# Patient Record
Sex: Male | Born: 1955 | Race: Black or African American | Hispanic: No | Marital: Single | Smoking: Former smoker
Health system: Southern US, Community
[De-identification: ages and names within clinical notes are randomized; demographics above are authoritative.]

## PROBLEM LIST (undated history)

## (undated) DIAGNOSIS — M1990 Unspecified osteoarthritis, unspecified site: Secondary | ICD-10-CM

## (undated) DIAGNOSIS — J189 Pneumonia, unspecified organism: Secondary | ICD-10-CM

## (undated) DIAGNOSIS — C801 Malignant (primary) neoplasm, unspecified: Secondary | ICD-10-CM

## (undated) DIAGNOSIS — H269 Unspecified cataract: Secondary | ICD-10-CM

## (undated) DIAGNOSIS — E119 Type 2 diabetes mellitus without complications: Secondary | ICD-10-CM

## (undated) DIAGNOSIS — I10 Essential (primary) hypertension: Secondary | ICD-10-CM

## (undated) DIAGNOSIS — K219 Gastro-esophageal reflux disease without esophagitis: Secondary | ICD-10-CM

## (undated) DIAGNOSIS — K8020 Calculus of gallbladder without cholecystitis without obstruction: Secondary | ICD-10-CM

## (undated) HISTORY — DX: Gastro-esophageal reflux disease without esophagitis: K21.9

## (undated) HISTORY — PX: TONSILLECTOMY: SUR1361

## (undated) HISTORY — PX: PROSTATE BIOPSY: SHX241

## (undated) HISTORY — DX: Calculus of gallbladder without cholecystitis without obstruction: K80.20

## (undated) HISTORY — DX: Unspecified cataract: H26.9

## (undated) HISTORY — PX: OTHER SURGICAL HISTORY: SHX169

---

## 2020-02-16 ENCOUNTER — Other Ambulatory Visit: Payer: Self-pay

## 2020-02-16 ENCOUNTER — Ambulatory Visit (HOSPITAL_COMMUNITY)
Admission: EM | Admit: 2020-02-16 | Discharge: 2020-02-16 | Disposition: A | Discharge status: Home / Self Care | Payer: Federal, State, Local not specified - PPO | Attending: Internal Medicine | Admitting: Internal Medicine

## 2020-02-16 ENCOUNTER — Encounter (HOSPITAL_COMMUNITY): Payer: Self-pay

## 2020-02-16 DIAGNOSIS — Z20822 Contact with and (suspected) exposure to covid-19: Secondary | ICD-10-CM | POA: Insufficient documentation

## 2020-02-16 HISTORY — DX: Type 2 diabetes mellitus without complications: E11.9

## 2020-02-16 HISTORY — DX: Essential (primary) hypertension: I10

## 2020-02-16 NOTE — ED Triage Notes (Signed)
Pt presents for COVID testing after exposure. Denies any symptoms.  

## 2020-02-17 LAB — SARS CORONAVIRUS 2 (TAT 6-24 HRS): SARS Coronavirus 2: NEGATIVE

## 2020-08-21 ENCOUNTER — Other Ambulatory Visit: Payer: Self-pay

## 2020-08-21 ENCOUNTER — Observation Stay (HOSPITAL_COMMUNITY)
Admission: EM | Admit: 2020-08-21 | Discharge: 2020-08-25 | Disposition: A | Discharge status: Home / Self Care | Payer: Medicare Other | Attending: Family Medicine | Admitting: Family Medicine

## 2020-08-21 ENCOUNTER — Encounter (HOSPITAL_COMMUNITY): Payer: Self-pay

## 2020-08-21 DIAGNOSIS — K8064 Calculus of gallbladder and bile duct with chronic cholecystitis without obstruction: Principal | ICD-10-CM | POA: Insufficient documentation

## 2020-08-21 DIAGNOSIS — K8050 Calculus of bile duct without cholangitis or cholecystitis without obstruction: Secondary | ICD-10-CM

## 2020-08-21 DIAGNOSIS — I10 Essential (primary) hypertension: Secondary | ICD-10-CM | POA: Diagnosis not present

## 2020-08-21 DIAGNOSIS — K838 Other specified diseases of biliary tract: Secondary | ICD-10-CM | POA: Insufficient documentation

## 2020-08-21 DIAGNOSIS — R1013 Epigastric pain: Secondary | ICD-10-CM | POA: Diagnosis present

## 2020-08-21 DIAGNOSIS — E119 Type 2 diabetes mellitus without complications: Secondary | ICD-10-CM | POA: Insufficient documentation

## 2020-08-21 DIAGNOSIS — Z20822 Contact with and (suspected) exposure to covid-19: Secondary | ICD-10-CM | POA: Diagnosis not present

## 2020-08-21 DIAGNOSIS — E876 Hypokalemia: Secondary | ICD-10-CM | POA: Diagnosis not present

## 2020-08-21 DIAGNOSIS — K8020 Calculus of gallbladder without cholecystitis without obstruction: Secondary | ICD-10-CM

## 2020-08-21 DIAGNOSIS — Z7984 Long term (current) use of oral hypoglycemic drugs: Secondary | ICD-10-CM | POA: Insufficient documentation

## 2020-08-21 DIAGNOSIS — R748 Abnormal levels of other serum enzymes: Secondary | ICD-10-CM

## 2020-08-21 DIAGNOSIS — K8071 Calculus of gallbladder and bile duct without cholecystitis with obstruction: Secondary | ICD-10-CM

## 2020-08-21 DIAGNOSIS — Z79899 Other long term (current) drug therapy: Secondary | ICD-10-CM | POA: Diagnosis not present

## 2020-08-21 DIAGNOSIS — E785 Hyperlipidemia, unspecified: Secondary | ICD-10-CM

## 2020-08-21 DIAGNOSIS — Z419 Encounter for procedure for purposes other than remedying health state, unspecified: Secondary | ICD-10-CM

## 2020-08-21 LAB — COMPREHENSIVE METABOLIC PANEL
ALT: 444 U/L — ABNORMAL HIGH (ref 0–44)
AST: 365 U/L — ABNORMAL HIGH (ref 15–41)
Albumin: 3.9 g/dL (ref 3.5–5.0)
Alkaline Phosphatase: 297 U/L — ABNORMAL HIGH (ref 38–126)
Anion gap: 13 (ref 5–15)
BUN: 17 mg/dL (ref 8–23)
CO2: 26 mmol/L (ref 22–32)
Calcium: 9.4 mg/dL (ref 8.9–10.3)
Chloride: 100 mmol/L (ref 98–111)
Creatinine, Ser: 1.17 mg/dL (ref 0.61–1.24)
GFR, Estimated: >60 mL/min (ref >60)
Glucose, Bld: 167 mg/dL — ABNORMAL HIGH (ref 70–99)
Potassium: 3.4 mmol/L — ABNORMAL LOW (ref 3.5–5.1)
Sodium: 139 mmol/L (ref 135–145)
Total Bilirubin: 2.5 mg/dL — ABNORMAL HIGH (ref 0.3–1.2)
Total Protein: 7.5 g/dL (ref 6.5–8.1)

## 2020-08-21 LAB — CBC
HCT: 44.7 % (ref 39.0–52.0)
Hemoglobin: 14.2 g/dL (ref 13.0–17.0)
MCH: 26.9 pg (ref 26.0–34.0)
MCHC: 31.8 g/dL (ref 30.0–36.0)
MCV: 84.7 fL (ref 80.0–100.0)
Platelets: 199 10*3/uL (ref 150–400)
RBC: 5.28 MIL/uL (ref 4.22–5.81)
RDW: 15.5 % (ref 11.5–15.5)
WBC: 8.1 10*3/uL (ref 4.0–10.5)
nRBC: 0 % (ref 0.0–0.2)

## 2020-08-21 LAB — URINALYSIS, ROUTINE W REFLEX MICROSCOPIC
Bilirubin Urine: NEGATIVE
Glucose, UA: NEGATIVE mg/dL
Hgb urine dipstick: NEGATIVE
Ketones, ur: NEGATIVE mg/dL
Leukocytes,Ua: NEGATIVE
Nitrite: NEGATIVE
Protein, ur: NEGATIVE mg/dL
Specific Gravity, Urine: 1.02 (ref 1.005–1.030)
pH: 6 (ref 5.0–8.0)

## 2020-08-21 LAB — LIPASE, BLOOD: Lipase: 32 U/L (ref 11–51)

## 2020-08-21 NOTE — ED Notes (Signed)
Urinal placed at bedside and requested for patient to provide a sample and to use call bell when he can provide one.

## 2020-08-21 NOTE — ED Triage Notes (Signed)
Pt reports intermittent epigastric x1 month with mild nausea. Denies V/D. Pt states he has trie multiple OTC medications and even used a fleet enema with no relief.

## 2020-08-21 NOTE — ED Provider Notes (Incomplete)
[facility]-EMERGENCY DEPT Provider Note   CSN: [id number] Arrival date & time: 08/21/20  2156     History Chief Complaint  Patient presents with  . Abdominal Pain    Lawrence Macdonald is a 65 y.o. male with PMH of type II DM and HTN who presents the ED with a 1 month history of epigastric discomfort with mild nausea symptoms.  Patient states that he has tried over-the-counter medications including Fleet enema without any significant relief.  On my examination, patient reports that he recently moved here from [place] to retire. He has not yet established with a primary care provider. He has been experiencing epigastric abdominal discomfort, worse after eating, for the past month. He states the episodes last approximately 3 to 4 hours before resolving spontaneously. On my exam, he is nontoxic endorsing any current abdominal pain. He states that he has early satiety and battles constipation. He has been trying enemas in addition to laxative medications, without any significant relief. He also has been taking proton pump inhibitors over-the-counter without any significant relief of his epigastric discomfort.  Patient is on Metformin for diabetes and also takes multiple antihypertensive medications. He denies any chest pain, fevers or chills, recent illness or infection, history of abdominal surgeries, urinary symptoms, melena, or hematochezia. Most recent BM was earlier today.   HPI     Past Medical History:  Diagnosis Date  . Diabetes mellitus without complication (HCC)   . Hypertension     There are no problems to display for this patient.   Past Surgical History:  Procedure Laterality Date  . TONSILLECTOMY         History reviewed. No pertinent family history.  Social History   Tobacco Use  . Smoking status: Never Smoker  . Smokeless tobacco: Never Used  Substance Use Topics  . Alcohol use: Yes  . Drug use: Never    Home Medications Prior to  Admission medications   Medication Sig Start Date End Date Taking? Authorizing Provider  albuterol (VENTOLIN HFA) 108 (90 Base) MCG/ACT inhaler  05/28/16   [provider]  amLODipine (NORVASC) 10 MG tablet Take by mouth. 06/26/19 06/20/20  [provider]  metFORMIN (GLUCOPHAGE) 500 MG tablet Take 500 mg by mouth daily. 02/13/20   [provider]  sildenafil (VIAGRA) 50 MG tablet SMARTSIG:1 Tablet(s) By Mouth 01/26/20   [provider]  valsartan (DIOVAN) 320 MG tablet Take 320 mg by mouth daily. 11/25/19   [provider]    Allergies    Hydrochlorothiazide, Lisinopril, and Sulfadiazine  Review of Systems   Review of Systems  Physical Exam Updated Vital Signs BP (!) 151/79   Pulse 75   Temp 98.4 F (36.9 C) (Oral)   Resp 15   SpO2 97%   Physical Exam Vitals and nursing note reviewed. Exam conducted with a chaperone present.  HENT:     Head: Normocephalic and atraumatic.  Eyes:     General: No scleral icterus.    Conjunctiva/sclera: Conjunctivae normal.  Cardiovascular:     Rate and Rhythm: Normal rate and regular rhythm.     Pulses: Normal pulses.  Pulmonary:     Effort: Pulmonary effort is normal. No respiratory distress.     Breath sounds: No wheezing or rales.  Abdominal:     General: Abdomen is flat. There is no distension.     Palpations: Abdomen is soft.     Tenderness: There is abdominal tenderness. There is no guarding.  Comments: Mild generalized tenderness to palpation, most notably in epigastrium. No overlying skin changes. Mild diastases recti. Protuberant abdomen. Soft. Negative Murphy sign. Negative McBurney's point tenderness. NABS.  Skin:    General: Skin is dry.  Neurological:     Mental Status: He is alert.     GCS: GCS eye subscore is 4. GCS verbal subscore is 5. GCS motor subscore is 6.  Psychiatric:        Mood and Affect: Mood normal.        Behavior: Behavior normal.        Thought Content: Thought  content normal.     ED Results / Procedures / Treatments   Labs (all labs ordered are listed, but only abnormal results are displayed) Labs Reviewed  CBC  LIPASE, BLOOD  COMPREHENSIVE METABOLIC PANEL  URINALYSIS, ROUTINE W REFLEX MICROSCOPIC    EKG None  Radiology No results found.  Procedures Procedures {Remember to document critical care time when appropriate:1}  Medications Ordered in ED Medications - No data to display  ED Course  I have reviewed the triage vital signs and the nursing notes.  Pertinent labs & imaging results that were available during my care of the patient were reviewed by me and considered in my medical decision making (see chart for details).    MDM Rules/Calculators/A&P                          Lawrence Macdonald was evaluated in Emergency Department on 08/21/2020 for the symptoms described in the history of present illness. He was evaluated in the context of the global COVID-19 pandemic, which necessitated consideration that the patient might be at risk for infection with the SARS-CoV-2 virus that causes COVID-19. Institutional protocols and algorithms that pertain to the evaluation of patients at risk for COVID-19 are in a state of rapid change based on information released by regulatory bodies including the CDC and federal and state organizations. These policies and algorithms were followed during the patient's care in the ED.  I personally reviewed patient's medical chart and all notes from triage and staff during today's encounter. I have also ordered and reviewed all labs and imaging that I felt to be medically necessary in the evaluation of this patient's complaints and with consideration of their physical exam. If needed, translation services were available and utilized.   ***      Final Clinical Impression(s) / ED Diagnoses Final diagnoses:  None    Rx / DC Orders ED Discharge Orders    None

## 2020-08-22 ENCOUNTER — Emergency Department (HOSPITAL_COMMUNITY): Payer: Medicare Other

## 2020-08-22 ENCOUNTER — Observation Stay (HOSPITAL_COMMUNITY): Payer: Medicare Other

## 2020-08-22 DIAGNOSIS — I10 Essential (primary) hypertension: Secondary | ICD-10-CM | POA: Diagnosis not present

## 2020-08-22 DIAGNOSIS — R748 Abnormal levels of other serum enzymes: Secondary | ICD-10-CM | POA: Diagnosis not present

## 2020-08-22 DIAGNOSIS — E785 Hyperlipidemia, unspecified: Secondary | ICD-10-CM

## 2020-08-22 DIAGNOSIS — K8001 Calculus of gallbladder with acute cholecystitis with obstruction: Secondary | ICD-10-CM

## 2020-08-22 DIAGNOSIS — E876 Hypokalemia: Secondary | ICD-10-CM

## 2020-08-22 DIAGNOSIS — K8064 Calculus of gallbladder and bile duct with chronic cholecystitis without obstruction: Secondary | ICD-10-CM | POA: Diagnosis not present

## 2020-08-22 DIAGNOSIS — K8020 Calculus of gallbladder without cholecystitis without obstruction: Secondary | ICD-10-CM | POA: Diagnosis present

## 2020-08-22 LAB — HIV ANTIBODY (ROUTINE TESTING W REFLEX): HIV Screen 4th Generation wRfx: NONREACTIVE

## 2020-08-22 LAB — HEPATIC FUNCTION PANEL
ALT: 505 U/L — ABNORMAL HIGH (ref 0–44)
AST: 400 U/L — ABNORMAL HIGH (ref 15–41)
Albumin: 3.7 g/dL (ref 3.5–5.0)
Alkaline Phosphatase: 302 U/L — ABNORMAL HIGH (ref 38–126)
Bilirubin, Direct: 1.7 mg/dL — ABNORMAL HIGH (ref 0.0–0.2)
Indirect Bilirubin: 1.6 mg/dL — ABNORMAL HIGH (ref 0.3–0.9)
Total Bilirubin: 3.3 mg/dL — ABNORMAL HIGH (ref 0.3–1.2)
Total Protein: 7.2 g/dL (ref 6.5–8.1)

## 2020-08-22 LAB — CBC
HCT: 46 % (ref 39.0–52.0)
Hemoglobin: 14.8 g/dL (ref 13.0–17.0)
MCH: 27.1 pg (ref 26.0–34.0)
MCHC: 32.2 g/dL (ref 30.0–36.0)
MCV: 84.2 fL (ref 80.0–100.0)
Platelets: 199 10*3/uL (ref 150–400)
RBC: 5.46 MIL/uL (ref 4.22–5.81)
RDW: 15.5 % (ref 11.5–15.5)
WBC: 7.9 10*3/uL (ref 4.0–10.5)
nRBC: 0 % (ref 0.0–0.2)

## 2020-08-22 LAB — GLUCOSE, CAPILLARY
Glucose-Capillary: 154 mg/dL — ABNORMAL HIGH (ref 70–99)
Glucose-Capillary: 124 mg/dL — ABNORMAL HIGH (ref 70–99)
Glucose-Capillary: 98 mg/dL (ref 70–99)

## 2020-08-22 LAB — HEMOGLOBIN A1C
Hgb A1c MFr Bld: 7.2 % — ABNORMAL HIGH (ref 4.8–5.6)
Mean Plasma Glucose: 159.94 mg/dL

## 2020-08-22 LAB — SARS CORONAVIRUS 2 (TAT 6-24 HRS): SARS Coronavirus 2: NEGATIVE

## 2020-08-22 LAB — MAGNESIUM: Magnesium: 2.2 mg/dL (ref 1.7–2.4)

## 2020-08-22 LAB — CBG MONITORING, ED: Glucose-Capillary: 196 mg/dL — ABNORMAL HIGH (ref 70–99)

## 2020-08-22 MED ORDER — INFLUENZA VAC A&B SA ADJ QUAD 0.5 ML IM PRSY
0.5000 mL | PREFILLED_SYRINGE | INTRAMUSCULAR | Status: DC
  Filled 2020-08-22: qty 0.5

## 2020-08-22 MED ORDER — ONDANSETRON HCL 4 MG/2ML IJ SOLN
4.0000 mg | Freq: Four times a day (QID) | INTRAMUSCULAR | Status: DC | PRN
  Administered 2020-08-22: 4 mg via INTRAVENOUS
  Filled 2020-08-22: qty 2

## 2020-08-22 MED ORDER — AMLODIPINE BESYLATE 10 MG PO TABS
10.0000 mg | ORAL_TABLET | Freq: Every day | ORAL | Status: DC
  Administered 2020-08-22 – 2020-08-25 (×3): 10 mg via ORAL
  Filled 2020-08-22 (×3): qty 1

## 2020-08-22 MED ORDER — ATORVASTATIN CALCIUM 40 MG PO TABS: 40.0000 mg | ORAL_TABLET | Freq: Every day | ORAL | Status: DC

## 2020-08-22 MED ORDER — INSULIN ASPART 100 UNIT/ML ~~LOC~~ SOLN
0.0000 [IU] | SUBCUTANEOUS | Status: DC
  Administered 2020-08-22: 2 [IU] via SUBCUTANEOUS
  Administered 2020-08-22: 1 [IU] via SUBCUTANEOUS
  Administered 2020-08-22: 2 [IU] via SUBCUTANEOUS
  Administered 2020-08-22: 1 [IU] via SUBCUTANEOUS
  Administered 2020-08-23: 3 [IU] via SUBCUTANEOUS
  Administered 2020-08-24: 2 [IU] via SUBCUTANEOUS
  Administered 2020-08-24 (×2): 1 [IU] via SUBCUTANEOUS
  Administered 2020-08-24: 2 [IU] via SUBCUTANEOUS
  Administered 2020-08-25 (×2): 1 [IU] via SUBCUTANEOUS
  Filled 2020-08-22: qty 0.09

## 2020-08-22 MED ORDER — GADOBUTROL 1 MMOL/ML IV SOLN
10.0000 mL | Freq: Once | INTRAVENOUS | Status: AC | PRN
  Administered 2020-08-22: 10 mL via INTRAVENOUS

## 2020-08-22 MED ORDER — SODIUM CHLORIDE 0.9 % IV SOLN
2.0000 g | INTRAVENOUS | Status: DC
  Administered 2020-08-22 – 2020-08-25 (×4): 2 g via INTRAVENOUS
  Filled 2020-08-22: qty 2
  Filled 2020-08-22: qty 20
  Filled 2020-08-22 (×2): qty 2

## 2020-08-22 MED ORDER — SODIUM CHLORIDE 0.9 % IV SOLN: INTRAVENOUS | Status: DC

## 2020-08-22 MED ORDER — HEPARIN SODIUM (PORCINE) 5000 UNIT/ML IJ SOLN
5000.0000 [IU] | Freq: Three times a day (TID) | INTRAMUSCULAR | Status: DC
  Administered 2020-08-22 (×3): 5000 [IU] via SUBCUTANEOUS
  Filled 2020-08-22 (×3): qty 1

## 2020-08-22 MED ORDER — IRBESARTAN 150 MG PO TABS
300.0000 mg | ORAL_TABLET | Freq: Every day | ORAL | Status: DC
  Administered 2020-08-22 – 2020-08-25 (×3): 300 mg via ORAL
  Filled 2020-08-22: qty 1
  Filled 2020-08-22 (×3): qty 2

## 2020-08-22 MED ORDER — METRONIDAZOLE IN NACL 5-0.79 MG/ML-% IV SOLN
500.0000 mg | Freq: Three times a day (TID) | INTRAVENOUS | Status: DC
  Administered 2020-08-22 – 2020-08-25 (×10): 500 mg via INTRAVENOUS
  Filled 2020-08-22 (×10): qty 100

## 2020-08-22 MED ORDER — POTASSIUM CHLORIDE 10 MEQ/100ML IV SOLN
10.0000 meq | INTRAVENOUS | Status: AC
  Administered 2020-08-22 (×2): 10 meq via INTRAVENOUS
  Filled 2020-08-22 (×2): qty 100

## 2020-08-22 MED ORDER — LORAZEPAM 2 MG/ML IJ SOLN
1.0000 mg | Freq: Once | INTRAMUSCULAR | Status: AC
  Administered 2020-08-22: 1 mg via INTRAVENOUS
  Filled 2020-08-22: qty 1

## 2020-08-22 MED ORDER — MORPHINE SULFATE (PF) 2 MG/ML IV SOLN
1.0000 mg | INTRAVENOUS | Status: DC | PRN
  Administered 2020-08-22: 1 mg via INTRAVENOUS
  Filled 2020-08-22: qty 1

## 2020-08-22 MED ORDER — LACTATED RINGERS IV SOLN: INTRAVENOUS | Status: DC

## 2020-08-22 MED ORDER — HYDRALAZINE HCL 50 MG PO TABS
50.0000 mg | ORAL_TABLET | Freq: Two times a day (BID) | ORAL | Status: DC
  Administered 2020-08-22 – 2020-08-25 (×7): 50 mg via ORAL
  Filled 2020-08-22 (×3): qty 1
  Filled 2020-08-22: qty 2
  Filled 2020-08-22 (×3): qty 1

## 2020-08-22 NOTE — Consult Note (Signed)
Referring Provider: Evelena Leyden, PA-C (ED) Primary Care Physician:  Patient, No Pcp Per Primary Gastroenterologist:  Gentry Fitz  Reason for Consultation:  Abnormal LFTs  HPI: Lawrence Macdonald is a 65 y.o. male with history of DM type 2 and HTN presenting for consultation of abnormal LFTs.  He presented to the ED yesterday with epigastric and RUQ abdominal pain.  He states he has been having this pain intermittently for 2-3 weeks, but it acutely worsened yesterday, which led him to present to the ED.  Pain is typically worse after eating.  He denies pain currently, as he just received pain medication.  He has nausea but denies vomiting.  Denies changes in bowel habits, melena, hematochezia, unexplained weight loss.  He denies ASA, NSAID, or blood thinner use.  Denies family history of colon cancer or gastrointestinal malignancy. He has never had a colonoscopy.  Past Medical History:  Diagnosis Date  . Diabetes mellitus without complication (HCC)   . Hypertension     Past Surgical History:  Procedure Laterality Date  . TONSILLECTOMY      Prior to Admission medications   Medication Sig Start Date End Date Taking? Authorizing Provider  albuterol (VENTOLIN HFA) 108 (90 Base) MCG/ACT inhaler  05/28/16  Yes [provider]  amLODipine (NORVASC) 10 MG tablet Take 10 mg by mouth daily. 06/26/19 08/22/20 Yes [provider]  atorvastatin (LIPITOR) 40 MG tablet Take 40 mg by mouth daily.   Yes [provider]  hydrALAZINE (APRESOLINE) 50 MG tablet Take 50 mg by mouth 2 (two) times daily. 06/26/20  Yes [provider]  metFORMIN (GLUCOPHAGE) 500 MG tablet Take 500 mg by mouth daily. 02/13/20  Yes [provider]  naproxen sodium (ALEVE) 220 MG tablet Take 220 mg by mouth 2 (two) times daily as needed (pain).   Yes [provider]  valsartan (DIOVAN) 320 MG tablet Take 320 mg by mouth daily. 11/25/19  Yes [provider]    Scheduled  Meds: . amLODipine  10 mg Oral Daily  . heparin  5,000 Units Subcutaneous Q8H  . hydrALAZINE  50 mg Oral BID  . [START ON 08/23/2020] influenza vaccine adjuvanted  0.5 mL Intramuscular Tomorrow-1000  . insulin aspart  0-9 Units Subcutaneous Q4H  . irbesartan  300 mg Oral Daily   Continuous Infusions: . cefTRIAXone (ROCEPHIN)  IV Stopped (08/22/20 0417)  . lactated ringers    . metronidazole Stopped (08/22/20 0521)   PRN Meds:.morphine injection, ondansetron (ZOFRAN) IV  Allergies as of 08/21/2020 - Review Complete 08/21/2020  Allergen Reaction Noted  . Hydrochlorothiazide  05/28/2016  . Lisinopril Other (See Comments) 05/28/2016  . Sulfadiazine Rash 02/16/2020    History reviewed. No pertinent family history.  Social History   Socioeconomic History  . Marital status: Single    Spouse name: Not on file  . Number of children: Not on file  . Years of education: Not on file  . Highest education level: Not on file  Occupational History  . Not on file  Tobacco Use  . Smoking status: Never Smoker  . Smokeless tobacco: Never Used  Substance and Sexual Activity  . Alcohol use: Yes  . Drug use: Never  . Sexual activity: Not on file  Other Topics Concern  . Not on file  Social History Narrative  . Not on file   Social Determinants of Health   Financial Resource Strain: Not on file  Food Insecurity: Not on file  Transportation Needs: Not on file  Physical Activity:  Not on file  Stress: Not on file  Social Connections: Not on file  Intimate Partner Violence: Not on file    Review of Systems: Review of Systems  Constitutional: Negative for chills, fever and weight loss.  HENT: Negative for hearing loss and tinnitus.   Eyes: Negative for pain and redness.  Respiratory: Negative for cough and shortness of breath.   Cardiovascular: Negative for chest pain and palpitations.  Gastrointestinal: Positive for abdominal pain and nausea. Negative for blood in stool,  constipation, diarrhea, heartburn, melena and vomiting.  Genitourinary: Negative for flank pain and hematuria.  Musculoskeletal: Negative for back pain and neck pain.  Skin: Negative for itching and rash.  Neurological: Negative for seizures and loss of consciousness.  Endo/Heme/Allergies: Negative for polydipsia. Does not bruise/bleed easily.  Psychiatric/Behavioral: Negative for substance abuse. The patient is not nervous/anxious.      Physical Exam: Vital signs: Vitals:   08/22/20 0600 08/22/20 0756  BP: 127/74 123/75  Pulse: 74 63  Resp: 16 16  Temp:  98.3 F (36.8 C)  SpO2: 93% 96%      Physical Exam Vitals reviewed.  Constitutional:      General: He is sleeping. He is not in acute distress.    Appearance: He is overweight.  HENT:     Head: Normocephalic and atraumatic.     Nose: Nose normal. No congestion.     Mouth/Throat:     Mouth: Mucous membranes are moist.     Pharynx: Oropharynx is clear.  Eyes:     General: Scleral icterus present.     Extraocular Movements: Extraocular movements intact.  Cardiovascular:     Rate and Rhythm: Normal rate and regular rhythm.     Pulses: Normal pulses.  Pulmonary:     Effort: Pulmonary effort is normal. No respiratory distress.     Breath sounds: Normal breath sounds.  Abdominal:     General: Bowel sounds are normal. There is no distension.     Palpations: Abdomen is soft. There is no mass.     Tenderness: There is no abdominal tenderness. There is no guarding or rebound.     Hernia: No hernia is present.  Musculoskeletal:        General: No swelling or tenderness.     Cervical back: Normal range of motion and neck supple.  Skin:    General: Skin is warm and dry.  Neurological:     General: No focal deficit present.     Mental Status: He is oriented to person, place, and time. He is lethargic.  Psychiatric:        Mood and Affect: Mood normal.        Behavior: Behavior normal. Behavior is cooperative.      GI:   Lab Results: Recent Labs    08/21/20 2211 08/22/20 0302  WBC 8.1 7.9  HGB 14.2 14.8  HCT 44.7 46.0  PLT 199 199   BMET Recent Labs    08/21/20 2211  NA 139  K 3.4*  CL 100  CO2 26  GLUCOSE 167*  BUN 17  CREATININE 1.17  CALCIUM 9.4   LFT Recent Labs    08/22/20 0302  PROT 7.2  ALBUMIN 3.7  AST 400*  ALT 505*  ALKPHOS 302*  BILITOT 3.3*  BILIDIR 1.7*  IBILI 1.6*   PT/INR No results for input(s): LABPROT, INR in the last 72 hours.   Studies/Results: MR 3D Recon At Scanner  Addendum Date: 08/22/2020   ADDENDUM REPORT:  08/22/2020 10:30 ADDENDUM: The original report was by Dr. Gaylyn Rong. The following addendum is by Dr. Gaylyn Rong: I discussed this case with Dr. Leary Roca at 10:20 a.m. on 08/22/2020. Our discussion revolved around the appearance of the distal CBD on image 17 of series 11, where the conical tapering of the duct appears slightly truncated in a fashion that can sometimes indicate a stone. Unfortunately this image is limited by volume averaging, and the diagnostic thin-section MRCP images in this region are highly indistinct/obscured by motion artifact. In my original analysis of this case, I analyzed the axial images as a tie breaker in assessing this region, and the thin AP diameter of the distal CBD at about 2 mm in the lack of a well-defined filling defect as well as the motion artifact lead me to more conservatively indicate that no well-defined filling defect was identified. That said, the presence of small distal CBD stones is difficult to confidently exclude, and if based on complementary clinical indicators there is a high suspicion, procedural intervention for further characterization and potentially treatment might be appropriate. Electronically Signed   By: Gaylyn Rong M.D.   On: 08/22/2020 10:30   Result Date: 08/22/2020 CLINICAL DATA:  Right upper quadrant and epigastric pain for greater than 1 month, increasing. Ultrasound  showed bile duct dilatation along with cholelithiasis. EXAM: MRI ABDOMEN WITHOUT AND WITH CONTRAST (INCLUDING MRCP) TECHNIQUE: Multiplanar multisequence MR imaging of the abdomen was performed both before and after the administration of intravenous contrast. Heavily T2-weighted images of the biliary and pancreatic ducts were obtained, and three-dimensional MRCP images were rendered by post processing. CONTRAST:  34mL GADAVIST GADOBUTROL 1 MMOL/ML IV SOLN COMPARISON:  08/22/2020 ultrasound FINDINGS: Despite efforts by the technologist and patient, motion artifact is present on today's exam and could not be eliminated. This reduces exam sensitivity and specificity. Lower chest: Unremarkable suspected cardiomegaly. Otherwise unremarkable. Hepatobiliary: Multiple gallstones in the gallbladder measuring up to 2.6 cm in diameter. Gallbladder wall thickening is present. The common hepatic duct measures up to 0.8 cm for example on image 49 of series 6, with the confluence of the cystic duct and common hepatic duct well seen on image 22 of series 15. The common bile duct measures up to 0.6 cm in diameter for example on images 22-23 of series 15. I not see a well-defined filling defect in the common bile duct, although the dedicated MRCP images are blurred by motion artifact. There is a suggestion of mild periportal edema. Clustered likely reactive lymph nodes in the porta hepatis and peripancreatic region including a 1.0 cm portacaval lymph node. Pancreas:  Unremarkable Spleen:  Unremarkable Adrenals/Urinary Tract: The adrenal glands appear unremarkable. 1.1 cm fluid signal intensity lesion of the right kidney lower pole laterally compatible with cyst. Stomach/Bowel: Scattered diverticula of the descending colon. Vascular/Lymphatic:  Reactive lymph nodes in the porta hepatis. Other:  Trace perihepatic ascites. Musculoskeletal: There is evidence of lumbar spondylosis and degenerative disc disease, with suspicion for central  narrowing of the thecal sac at L1-2 and L2-3. IMPRESSION: 1. Cholelithiasis with gallbladder wall thickening and pericholecystic edema. Correlate clinically in assessing for acute cholecystitis. 2. Mild dilatation of the common hepatic duct, with the common bile duct currently within normal limits at 0.6 cm in diameter. No definite filling defect in the CBD, although motion artifact reduces sensitivity. 3. Clustered likely reactive lymph nodes in the porta hepatis and peripancreatic region including a 1.0 cm portacaval lymph node. 4. Despite efforts by the technologist and patient,  motion artifact is present on today's exam and could not be eliminated. This reduces exam sensitivity and specificity. 5. Scattered diverticula of the descending colon. 6. Trace perihepatic ascites. 7. Lumbar spondylosis and degenerative disc disease, with suspicion for central narrowing of the thecal sac at L1-2 and L2-3. Electronically Signed: By: Gaylyn RongWalter  Liebkemann M.D. On: 08/22/2020 07:49   US Abdomen Limited  Result Date: 08/22/2020 CLINICAL DATA:  Right upper quadrant and epigastric pain. EXAM: ULTRASOUND ABDOMEN LIMITED RIGHT UPPER QUADRANT COMPARISON:  None. FINDINGS: Gallbladder: Physiologic lead distended. Gallstones including a large 2.9 cm stone. There is mild wall thickening at 5 mm. No pericholecystic fluid. No sonographic Murphy sign noted by sonographer. Common bile duct: Diameter: 13 mm proximally, 14 mm distally. No visualized choledocholithiasis. Liver: No focal lesion identified. Within normal limits in parenchymal echogenicity. Portal vein is patent on color Doppler imaging with normal direction of blood flow towards the liver. Other: No upper quadrant ascites. Incidental note of prominent column of Bertin in the right kidney. IMPRESSION: 1. Gallstones with mild gallbladder wall thickening. No sonographic Murphy sign. Findings may represent acute cholecystitis in the appropriate clinical setting. 2. Dilated  common bile duct at 14 mm. No visualized choledocholithiasis. MRCP could be considered for biliary tree evaluation if patient is able to tolerate breath hold technique. Electronically Signed   By: Narda RutherfordMelanie  Sanford M.D.   On: 08/22/2020 00:42   MR ABDOMEN MRCP W WO CONTAST  Addendum Date: 08/22/2020   ADDENDUM REPORT: 08/22/2020 10:30 ADDENDUM: The original report was by Dr. Gaylyn RongWalter Liebkemann. The following addendum is by Dr. Gaylyn RongWalter Liebkemann: I discussed this case with Dr. Leary RocaMark Magod at 10:20 a.m. on 08/22/2020. Our discussion revolved around the appearance of the distal CBD on image 17 of series 11, where the conical tapering of the duct appears slightly truncated in a fashion that can sometimes indicate a stone. Unfortunately this image is limited by volume averaging, and the diagnostic thin-section MRCP images in this region are highly indistinct/obscured by motion artifact. In my original analysis of this case, I analyzed the axial images as a tie breaker in assessing this region, and the thin AP diameter of the distal CBD at about 2 mm in the lack of a well-defined filling defect as well as the motion artifact lead me to more conservatively indicate that no well-defined filling defect was identified. That said, the presence of small distal CBD stones is difficult to confidently exclude, and if based on complementary clinical indicators there is a high suspicion, procedural intervention for further characterization and potentially treatment might be appropriate. Electronically Signed   By: Gaylyn RongWalter  Liebkemann M.D.   On: 08/22/2020 10:30   Result Date: 08/22/2020 CLINICAL DATA:  Right upper quadrant and epigastric pain for greater than 1 month, increasing. Ultrasound showed bile duct dilatation along with cholelithiasis. EXAM: MRI ABDOMEN WITHOUT AND WITH CONTRAST (INCLUDING MRCP) TECHNIQUE: Multiplanar multisequence MR imaging of the abdomen was performed both before and after the administration of  intravenous contrast. Heavily T2-weighted images of the biliary and pancreatic ducts were obtained, and three-dimensional MRCP images were rendered by post processing. CONTRAST:  10mL GADAVIST GADOBUTROL 1 MMOL/ML IV SOLN COMPARISON:  08/22/2020 ultrasound FINDINGS: Despite efforts by the technologist and patient, motion artifact is present on today's exam and could not be eliminated. This reduces exam sensitivity and specificity. Lower chest: Unremarkable suspected cardiomegaly. Otherwise unremarkable. Hepatobiliary: Multiple gallstones in the gallbladder measuring up to 2.6 cm in diameter. Gallbladder wall thickening is present. The common hepatic  duct measures up to 0.8 cm for example on image 49 of series 6, with the confluence of the cystic duct and common hepatic duct well seen on image 22 of series 15. The common bile duct measures up to 0.6 cm in diameter for example on images 22-23 of series 15. I not see a well-defined filling defect in the common bile duct, although the dedicated MRCP images are blurred by motion artifact. There is a suggestion of mild periportal edema. Clustered likely reactive lymph nodes in the porta hepatis and peripancreatic region including a 1.0 cm portacaval lymph node. Pancreas:  Unremarkable Spleen:  Unremarkable Adrenals/Urinary Tract: The adrenal glands appear unremarkable. 1.1 cm fluid signal intensity lesion of the right kidney lower pole laterally compatible with cyst. Stomach/Bowel: Scattered diverticula of the descending colon. Vascular/Lymphatic:  Reactive lymph nodes in the porta hepatis. Other:  Trace perihepatic ascites. Musculoskeletal: There is evidence of lumbar spondylosis and degenerative disc disease, with suspicion for central narrowing of the thecal sac at L1-2 and L2-3. IMPRESSION: 1. Cholelithiasis with gallbladder wall thickening and pericholecystic edema. Correlate clinically in assessing for acute cholecystitis. 2. Mild dilatation of the common hepatic  duct, with the common bile duct currently within normal limits at 0.6 cm in diameter. No definite filling defect in the CBD, although motion artifact reduces sensitivity. 3. Clustered likely reactive lymph nodes in the porta hepatis and peripancreatic region including a 1.0 cm portacaval lymph node. 4. Despite efforts by the technologist and patient, motion artifact is present on today's exam and could not be eliminated. This reduces exam sensitivity and specificity. 5. Scattered diverticula of the descending colon. 6. Trace perihepatic ascites. 7. Lumbar spondylosis and degenerative disc disease, with suspicion for central narrowing of the thecal sac at L1-2 and L2-3. Electronically Signed: By: Gaylyn Rong M.D. On: 08/22/2020 07:49    Impression: Abnormal LFTs, cholelithiasis/cholecystitis, Choledocholithasis vs. passed stone -T. Bili 3.3 today, increased from 2.5 yesterday.  AST 400/ ALT 505/ ALP 302 -MRCP 08/22/20: cholelithiasis with gallbladder wall thickening and pericholecystic edema. Mild dilatation of the common hepatic duct, with the common bile duct currently within normal limits at 0.6 cm in diameter. No definite filling defect in the CBD, although motion artifact reduces sensitivity.  Plan: Dr. Ewing Schlein reviewed with radiology, agree with poor quality study based on motion artifact.  Questionable distal CBD stone.  Continue to trend LFTs.  If T. Bili continues to rise, plan for ERCP tomorrow.  Otherwise, recommend lap chole with IOC if down-trending T. Bili.  NPO after midnight. Diet today as per surgical team.  [facility] will follow.     LOS: 0 days   Edrick Kins  PA-C 08/22/2020, 10:49 AM  Contact #  [phone]

## 2020-08-22 NOTE — H&P (Signed)
History and Physical    Lawrence Macdonald POE:[id number] DOB: 09/22/1955 DOA: 08/21/2020  PCP: Patient, No Pcp Per Patient coming from: Home  Chief Complaint: Abdominal pain, nausea  HPI: Lawrence Macdonald is a 65 y.o. male with medical history significant of hypertension, hyperlipidemia, non-insulin-dependent type 2 diabetes presenting to the ED with complaints of abdominal pain and nausea.  Patient reports intermittent episodes of epigastric abdominal pain for several weeks which became worse today and now constant.  He is also experiencing pressure in his right upper quadrant.  He is feeling nauseous but has not vomited.  He feels that for the past few weeks these episodes were exacerbated by food intake, as such, he has not been eating much.  Denies history of gallstones.  He is vaccinated against COVID but has not received his booster shot yet.  Denies fevers, cough, shortness of breath, or chest pain.  ED Course: Blood pressure slightly elevated, remainder of vital signs stable.  WBC 8.1, hemoglobin 14.2, platelet count 199K.  Sodium 139, potassium 3.4, chloride 100, bicarb 26, BUN 17, creatinine 1.1, glucose 167.  AST 365, ALT 444, alk phos 297, T bili 2.5.  Lipase normal.  UA without signs of infection.  Screening SARS-CoV-2 PCR test pending.  Right upper quadrant ultrasound showing gallstones with mild gallbladder wall thickening but no sonographic Murphy sign.  Also showing dilated CBD of 14 mm but no visualized choledocholithiasis.  ED provider discussed the case with Dr. Zenia Resides who recommended MRCP/ERCP in the morning, general surgical will consult.  Review of Systems:  All systems reviewed and apart from history of presenting illness, are negative.  Past Medical History:  Diagnosis Date  . Diabetes mellitus without complication ([place])   . Hypertension     Past Surgical History:  Procedure Laterality Date  . TONSILLECTOMY       reports that he has never smoked. He has never used smokeless  tobacco. He reports current alcohol use. He reports that he does not use drugs.  Allergies  Allergen Reactions  . Hydrochlorothiazide     Other reaction(s): gout Caused patient to have gout   . Lisinopril Other (See Comments)    cough   . Sulfadiazine Rash    History reviewed. No pertinent family history.  Prior to Admission medications   Medication Sig Start Date End Date Taking? Authorizing Provider  albuterol (VENTOLIN HFA) 108 (90 Base) MCG/ACT inhaler  05/28/16  Yes [provider]  amLODipine (NORVASC) 10 MG tablet Take 10 mg by mouth daily. 06/26/19 08/22/20 Yes [provider]  atorvastatin (LIPITOR) 40 MG tablet Take 40 mg by mouth daily.   Yes [provider]  hydrALAZINE (APRESOLINE) 50 MG tablet Take 50 mg by mouth 2 (two) times daily. 06/26/20  Yes [provider]  metFORMIN (GLUCOPHAGE) 500 MG tablet Take 500 mg by mouth daily. 02/13/20  Yes [provider]  naproxen sodium (ALEVE) 220 MG tablet Take 220 mg by mouth 2 (two) times daily as needed (pain).   Yes [provider]  valsartan (DIOVAN) 320 MG tablet Take 320 mg by mouth daily. 11/25/19  Yes [provider]    Physical Exam: Vitals:   08/22/20 0049 08/22/20 0100 08/22/20 0130 08/22/20 0200  BP: (!) 147/94 (!) 152/79 (!) 168/92 (!) 166/82  Pulse: 83 74  70  Resp: '15 16  19  ' Temp:      TempSrc:      SpO2: 94% 96%  94%    Physical Exam Constitutional:  Comments: Appears slightly uncomfortable secondary to pain  HENT:     Head: Normocephalic and atraumatic.  Eyes:     Extraocular Movements: Extraocular movements intact.     Conjunctiva/sclera: Conjunctivae normal.  Cardiovascular:     Rate and Rhythm: Normal rate and regular rhythm.     Pulses: Normal pulses.  Pulmonary:     Effort: Pulmonary effort is normal. No respiratory distress.     Breath sounds: No wheezing or rales.  Abdominal:     General: Bowel sounds are normal.      Palpations: Abdomen is soft.     Comments: Epigastrium and right upper quadrant tender to palpation with guarding.  No rebound or rigidity.  Musculoskeletal:        General: No swelling or tenderness.     Cervical back: Normal range of motion and neck supple.  Skin:    General: Skin is warm and dry.  Neurological:     General: No focal deficit present.     Mental Status: He is alert and oriented to person, place, and time.     Labs on Admission: I have personally reviewed following labs and imaging studies  CBC: Recent Labs  Lab 08/21/20 2211  WBC 8.1  HGB 14.2  HCT 44.7  MCV 84.7  PLT 115   Basic Metabolic Panel: Recent Labs  Lab 08/21/20 2211  NA 139  K 3.4*  CL 100  CO2 26  GLUCOSE 167*  BUN 17  CREATININE 1.17  CALCIUM 9.4   GFR: CrCl cannot be calculated (Unknown ideal weight.). Liver Function Tests: Recent Labs  Lab 08/21/20 2211  AST 365*  ALT 444*  ALKPHOS 297*  BILITOT 2.5*  PROT 7.5  ALBUMIN 3.9   Recent Labs  Lab 08/21/20 2211  LIPASE 32   No results for input(s): AMMONIA in the last 168 hours. Coagulation Profile: No results for input(s): INR, PROTIME in the last 168 hours. Cardiac Enzymes: No results for input(s): CKTOTAL, CKMB, CKMBINDEX, TROPONINI in the last 168 hours. BNP (last 3 results) No results for input(s): PROBNP in the last 8760 hours. HbA1C: No results for input(s): HGBA1C in the last 72 hours. CBG: No results for input(s): GLUCAP in the last 168 hours. Lipid Profile: No results for input(s): CHOL, HDL, LDLCALC, TRIG, CHOLHDL, LDLDIRECT in the last 72 hours. Thyroid Function Tests: No results for input(s): TSH, T4TOTAL, FREET4, T3FREE, THYROIDAB in the last 72 hours. Anemia Panel: No results for input(s): VITAMINB12, FOLATE, FERRITIN, TIBC, IRON, RETICCTPCT in the last 72 hours. Urine analysis:    Component Value Date/Time   COLORURINE AMBER (A) 08/21/2020 2211   APPEARANCEUR CLEAR 08/21/2020 2211   LABSPEC 1.020  08/21/2020 2211   PHURINE 6.0 08/21/2020 2211   GLUCOSEU NEGATIVE 08/21/2020 2211   HGBUR NEGATIVE 08/21/2020 2211   BILIRUBINUR NEGATIVE 08/21/2020 2211   KETONESUR NEGATIVE 08/21/2020 2211   PROTEINUR NEGATIVE 08/21/2020 2211   NITRITE NEGATIVE 08/21/2020 2211   LEUKOCYTESUR NEGATIVE 08/21/2020 2211    Radiological Exams on Admission: US Abdomen Limited  Result Date: 08/22/2020 CLINICAL DATA:  Right upper quadrant and epigastric pain. EXAM: ULTRASOUND ABDOMEN LIMITED RIGHT UPPER QUADRANT COMPARISON:  None. FINDINGS: Gallbladder: Physiologic lead distended. Gallstones including a large 2.9 cm stone. There is mild wall thickening at 5 mm. No pericholecystic fluid. No sonographic Murphy sign noted by sonographer. Common bile duct: Diameter: 13 mm proximally, 14 mm distally. No visualized choledocholithiasis. Liver: No focal lesion identified. Within normal limits in parenchymal echogenicity. Portal vein is  patent on color Doppler imaging with normal direction of blood flow towards the liver. Other: No upper quadrant ascites. Incidental note of prominent column of Bertin in the right kidney. IMPRESSION: 1. Gallstones with mild gallbladder wall thickening. No sonographic Murphy sign. Findings may represent acute cholecystitis in the appropriate clinical setting. 2. Dilated common bile duct at 14 mm. No visualized choledocholithiasis. MRCP could be considered for biliary tree evaluation if patient is able to tolerate breath hold technique. Electronically Signed   By: Keith Rake M.D.   On: 08/22/2020 00:42    Assessment/Plan Principal Problem:   Cholelithiasis Active Problems:   Elevated liver enzymes   Hypokalemia   HTN (hypertension)   HLD (hyperlipidemia)   Cholelithiasis with concern for possible choledocholithiasis  Elevated liver enzymes Right upper quadrant ultrasound showing gallstones with mild gallbladder wall thickening and dilated CBD of 14 mm but no visualized  choledocholithiasis.  Although sonographic Percell Miller sign was negative, right upper quadrant tender to palpation on exam.?Cholecystitis.  No fever, leukocytosis, or signs of sepsis.  Liver enzymes are elevated AST 365, ALT 444, alk phos 297, T bili 2.5.  Lipase normal. -ED provider discussed the case with Dr. Zenia Resides who recommended MRCP/ERCP in the morning, general surgical will consult. MRCP has been ordered for further evaluation.  Will start empiric antibiotics including ceftriaxone and Flagyl.  Secure chat message has been sent to Dr. Cristina Gong on-call [facility] provider given potential need for ERCP.  Keep n.p.o., IV fluid hydration.  Manage pain and nausea.  Continue to monitor LFTs.  Mild hypokalemia: Potassium 3.4. -Monitor potassium and magnesium levels, replenish as needed.  Hypertension: Blood pressure slightly elevated to systolic in the 756-433 range. -Resume home antihypertensives including amlodipine, hydralazine, and irbesartan.  Hyperlipidemia -Continue Lipitor  Non-insulin-dependent type 2 diabetes -Hold home Metformin.  Check A1c.  Sliding scale insulin sensitive every 4 hours.  DVT prophylaxis: Subcutaneous heparin Code Status: Full code Family Communication: No family available at this time. Disposition Plan: Status is: Observation  The patient remains OBS appropriate and will d/c before 2 midnights.  Dispo: The patient is from: Home              Anticipated d/c is to: Home              Anticipated d/c date is: 2 days              Patient currently is not medically stable to d/c.   Difficult to place patient No  Level of care: Level of care: Med-Surg   The medical decision making on this patient was of high complexity and the patient is at high risk for clinical deterioration, therefore this is a level 3 visit.  Shela Leff MD [facility]  If 7PM-7AM, please contact night-coverage [email or web address]  08/22/2020, 2:36 AM

## 2020-08-22 NOTE — ED Notes (Signed)
Anna Genre, NP notified by [facility] ED floor coverage for Ativan for patient before MRI.

## 2020-08-22 NOTE — Progress Notes (Addendum)
PROGRESS NOTE    Patient: Lawrence Macdonald                            PCP: Patient, No Pcp Per                    DOB: 15-May-1956            DOA: 08/21/2020 GDJ:[id number]             DOS: 08/22/2020, 6:52 AM   LOS: 0 days   Date of Service: The patient was seen and examined on 08/22/2020  Subjective:   The patient was seen and examined this morning. Stable at this time. NPO... Morphine has helped with pain Denies any nausea vomiting at this time Otherwise no issues overnight .  Brief Narrative:   Lawrence Macdonald is a 65 y.o. male with medical history significant of hypertension, hyperlipidemia, non-insulin-dependent type 2 diabetes presenting to the ED with complaints of abdominal pain and nausea. AST 365, ALT 444, alk phos 297, T bili 2.5.  Lipase normal.   UA without signs of infection.   Screening SARS-CoV-2 PCR test  Right upper quadrant ultrasound showing gallstones with mild gallbladder wall thickening but no sonographic Murphy sign.  Also showing dilated CBD of 14 mm but no visualized choledocholithiasis    Assessment & Plan:   Principal Problem:   Cholelithiasis Active Problems:   Elevated liver enzymes   Hypokalemia   HTN (hypertension)   HLD (hyperlipidemia)    Cholelithiasis with concern for possible choledocholithiasis  Elevated liver enzymes -Improved abdominal pain with analgesics-morphine  Right upper quadrant ultrasound showing gallstones with mild gallbladder wall thickening and dilated CBD of 14 mm but no visualized choledocholithiasis.  Although sonographic Percell Miller sign was negative ?Cholecystitis.  No fever, leukocytosis, or signs of sepsis.   -Liver enzymes are elevated: Worsening -avoiding hepatotoxins  AST 365 >> 400 ,   ALT 444 >> 505, alk,  phos 297 >> 302 , T bili 2.5. >>  3.3  Lipase normal. -Apparently case has been discussed with general surgery Dr. Zenia Resides -Pending MRCP/ERCP >>>  -GI also has been consulted -overnight secure chat has been sent  to Dr. Janeece Riggers on-call [facility] -We will continue gentle IV fluid hydration, antibiotics-ceftriaxone/Flagyl -N.P.O .  Mild hypokalemia: Potassium 3.4. -Monitor potassium and magnesium levels, replenish as needed.  Hypertension: Blood pressure slightly elevated to systolic in the 622-297 range. -Resume home antihypertensives including amlodipine, hydralazine, and irbesartan. -As needed hydralazine -Currently stable  Hyperlipidemia -Home medication of Lipitor -we will be holding due to elevated LFTs  Non-insulin-dependent type 2 diabetes -Hold home Metformin.   Check A1c.  Sliding scale insulin sensitive every 4 hours.  DVT prophylaxis: Subcutaneous heparin Code Status: Full code Family Communication: No family available at this time. Disposition Plan: Status is: Observation  The patient remains OBS appropriate and will d/c before 2 midnights.  Dispo: The patient is from: Home  Anticipated d/c is to: Home  Anticipated d/c date is: 2 days  Patient currently is not medically stable to d/c.              Difficult to place patient No  Level of care: Level of care: Med-Surg      ----------------------------------------------------------------------------------------------------------------------------------- Cultures; None     Antimicrobials: IV Rocephin/Flagyl   Consultants: General surgery/gastroenterologist  Level of care: Med-Surg   Procedures:   No admission procedures for hospital encounter.     Antimicrobials:  Anti-infectives (From admission, onward)   Start     Dose/Rate Route Frequency Ordered Stop   08/22/20 0315  cefTRIAXone (ROCEPHIN) 2 g in sodium chloride 0.9 % 100 mL IVPB        2 g 200 mL/hr over 30 Minutes Intravenous Every 24 hours 08/22/20 0303     08/22/20 0315  metroNIDAZOLE (FLAGYL) IVPB 500 mg        500 mg 100 mL/hr over 60 Minutes Intravenous Every 8 hours 08/22/20 0303          Medication:  . amLODipine  10 mg Oral Daily  . atorvastatin  40 mg Oral Daily  . heparin  5,000 Units Subcutaneous Q8H  . hydrALAZINE  50 mg Oral BID  . insulin aspart  0-9 Units Subcutaneous Q4H  . irbesartan  300 mg Oral Daily    morphine injection, ondansetron (ZOFRAN) IV   Objective:   Vitals:   08/22/20 0430 08/22/20 0500 08/22/20 0530 08/22/20 0600  BP: (!) 147/82 134/75 129/76 127/74  Pulse: 86 80 77 74  Resp: '16 14 15 16  ' Temp:      TempSrc:      SpO2: 93% 94% 94% 93%    Intake/Output Summary (Last 24 hours) at 08/22/2020 9983 Last data filed at 08/22/2020 0521 Gross per 24 hour  Intake 400 ml  Output --  Net 400 ml   There were no vitals filed for this visit.   Examination:   Physical Exam  Constitution:  Alert, cooperative, no distress,  Appears calm and comfortable  Psychiatric: Normal and stable mood and affect, cognition intact,   HEENT: Normocephalic, PERRL, otherwise with in Normal limits  Chest:Chest symmetric Cardio vascular:  S1/S2, RRR, No murmure, No Rubs or Gallops  pulmonary: Clear to auscultation bilaterally, respirations unlabored, negative wheezes / crackles Abdomen: Soft, right upper quadrant, epigastric tenderness, mild rebound tenderness, non-distended, bowel sounds,no masses, no organomegaly Muscular skeletal: Limited exam - in bed, able to move all 4 extremities, Normal strength,  Neuro: CNII-XII intact. , normal motor and sensation, reflexes intact  Extremities: No pitting edema lower extremities, +2 pulses  Skin: Dry, warm to touch, negative for any Rashes, No open wounds Wounds: per nursing documentation    ------------------------------------------------------------------------------------------------------------------------------------------    LABs:  CBC Latest Ref Rng & Units 08/22/2020 08/21/2020  WBC 4.0 - 10.5 K/uL 7.9 8.1  Hemoglobin 13.0 - 17.0 g/dL 14.8 14.2  Hematocrit 39.0 - 52.0 % 46.0 44.7  Platelets 150 -  400 K/uL 199 199   CMP Latest Ref Rng & Units 08/22/2020 08/21/2020  Glucose 70 - 99 mg/dL - 167(H)  BUN 8 - 23 mg/dL - 17  Creatinine 0.61 - 1.24 mg/dL - 1.17  Sodium 135 - 145 mmol/L - 139  Potassium 3.5 - 5.1 mmol/L - 3.4(L)  Chloride 98 - 111 mmol/L - 100  CO2 22 - 32 mmol/L - 26  Calcium 8.9 - 10.3 mg/dL - 9.4  Total Protein 6.5 - 8.1 g/dL 7.2 7.5  Total Bilirubin 0.3 - 1.2 mg/dL 3.3(H) 2.5(H)  Alkaline Phos 38 - 126 U/L 302(H) 297(H)  AST 15 - 41 U/L 400(H) 365(H)  ALT 0 - 44 U/L 505(H) 444(H)       Micro Results No results found for this or any previous visit (from the past 240 hour(s)).  Radiology Reports US Abdomen Limited  Result Date: 08/22/2020 CLINICAL DATA:  Right upper quadrant and epigastric pain. EXAM: ULTRASOUND ABDOMEN LIMITED RIGHT UPPER QUADRANT COMPARISON:  None. FINDINGS: Gallbladder:  Physiologic lead distended. Gallstones including a large 2.9 cm stone. There is mild wall thickening at 5 mm. No pericholecystic fluid. No sonographic Murphy sign noted by sonographer. Common bile duct: Diameter: 13 mm proximally, 14 mm distally. No visualized choledocholithiasis. Liver: No focal lesion identified. Within normal limits in parenchymal echogenicity. Portal vein is patent on color Doppler imaging with normal direction of blood flow towards the liver. Other: No upper quadrant ascites. Incidental note of prominent column of Bertin in the right kidney. IMPRESSION: 1. Gallstones with mild gallbladder wall thickening. No sonographic Murphy sign. Findings may represent acute cholecystitis in the appropriate clinical setting. 2. Dilated common bile duct at 14 mm. No visualized choledocholithiasis. MRCP could be considered for biliary tree evaluation if patient is able to tolerate breath hold technique. Electronically Signed   By: Keith Rake M.D.   On: 08/22/2020 00:42    SIGNED: Deatra James, MD, FHM. [facility],  Pager (please use [email or web address] to  page/text) Please use Epic Secure Chat for non-urgent communication (7AM-7PM)  If 7PM-7AM, please contact night-coverage [email or web address], 08/22/2020, 6:52 AM

## 2020-08-22 NOTE — ED Notes (Signed)
Admitting provider at bedside.

## 2020-08-22 NOTE — ED Provider Notes (Signed)
[facility]-EMERGENCY DEPT Provider Note   CSN: [id number] Arrival date & time: 08/21/20  2156     History Chief Complaint  Patient presents with  . Abdominal Pain    Lawrence Macdonald is a 65 y.o. male with PMH of type II DM and HTN who presents the ED with a 1 month history of epigastric discomfort with mild nausea symptoms.  Patient states that he has tried over-the-counter medications including Fleet enema without any significant relief.  On my examination, patient reports that he recently moved here from [place] to retire. He has not yet established with a primary care provider. He has been experiencing epigastric abdominal discomfort, worse after eating, for the past month. He states the episodes last approximately 3 to 4 hours before resolving spontaneously. On my exam, he is non endorsing any current abdominal pain. He states that he has early satiety and also battles constipation. He has been trying enemas in addition to laxative medications, without any significant relief. He also has been taking proton pump inhibitors over-the-counter without any significant relief of his epigastric discomfort.  Patient is on Metformin for diabetes and also takes multiple antihypertensive medications. He denies any chest pain, fevers or chills, recent illness or infection, history of abdominal surgeries, urinary symptoms, melena, or hematochezia. Most recent BM was earlier today.   HPI     Past Medical History:  Diagnosis Date  . Diabetes mellitus without complication (HCC)   . Hypertension     Patient Active Problem List   Diagnosis Date Noted  . Cholelithiasis 08/22/2020    Past Surgical History:  Procedure Laterality Date  . TONSILLECTOMY         History reviewed. No pertinent family history.  Social History   Tobacco Use  . Smoking status: Never Smoker  . Smokeless tobacco: Never Used  Substance Use Topics  . Alcohol use: Yes  . Drug use: Never     Home Medications Prior to Admission medications   Medication Sig Start Date End Date Taking? Authorizing Provider  albuterol (VENTOLIN HFA) 108 (90 Base) MCG/ACT inhaler  05/28/16  Yes [provider]  amLODipine (NORVASC) 10 MG tablet Take 10 mg by mouth daily. 06/26/19 08/22/20 Yes [provider]  atorvastatin (LIPITOR) 40 MG tablet Take 40 mg by mouth daily.   Yes [provider]  hydrALAZINE (APRESOLINE) 50 MG tablet Take 50 mg by mouth 2 (two) times daily. 06/26/20  Yes [provider]  metFORMIN (GLUCOPHAGE) 500 MG tablet Take 500 mg by mouth daily. 02/13/20  Yes [provider]  naproxen sodium (ALEVE) 220 MG tablet Take 220 mg by mouth 2 (two) times daily as needed (pain).   Yes [provider]  valsartan (DIOVAN) 320 MG tablet Take 320 mg by mouth daily. 11/25/19  Yes [provider]    Allergies    Hydrochlorothiazide, Lisinopril, and Sulfadiazine  Review of Systems   Review of Systems Review of Systems  All other systems reviewed and are negative.    Physical Exam Updated Vital Signs BP (!) 166/82   Pulse 70   Temp 98.4 F (36.9 C) (Oral)   Resp 19   SpO2 94%   Physical Exam Vitals and nursing note reviewed. Exam conducted with a chaperone present.  HENT:     Head: Normocephalic and atraumatic.  Eyes:     General: No scleral icterus.    Conjunctiva/sclera: Conjunctivae normal.  Cardiovascular:     Rate and Rhythm: Normal rate and regular  rhythm.     Pulses: Normal pulses.  Pulmonary:     Effort: Pulmonary effort is normal. No respiratory distress.     Breath sounds: No wheezing or rales.  Abdominal:     General: Abdomen is flat. There is no distension.     Palpations: Abdomen is soft.     Tenderness: There is abdominal tenderness. There is no guarding.     Comments: Mild generalized tenderness to palpation, most notably in epigastrium. No overlying skin changes. Mild diastases recti.  Protuberant abdomen. Soft. Negative Murphy sign. Negative McBurney's point tenderness. NABS.  Skin:    General: Skin is dry.  Neurological:     Mental Status: He is alert.     GCS: GCS eye subscore is 4. GCS verbal subscore is 5. GCS motor subscore is 6.  Psychiatric:        Mood and Affect: Mood normal.        Behavior: Behavior normal.        Thought Content: Thought content normal.     ED Results / Procedures / Treatments   Labs (all labs ordered are listed, but only abnormal results are displayed) Labs Reviewed  COMPREHENSIVE METABOLIC PANEL - Abnormal; Notable for the following components:      Result Value   Potassium 3.4 (*)    Glucose, Bld 167 (*)    AST 365 (*)    ALT 444 (*)    Alkaline Phosphatase 297 (*)    Total Bilirubin 2.5 (*)    All other components within normal limits  URINALYSIS, ROUTINE W REFLEX MICROSCOPIC - Abnormal; Notable for the following components:   Color, Urine AMBER (*)    All other components within normal limits  SARS CORONAVIRUS 2 (TAT 6-24 HRS)  LIPASE, BLOOD  CBC    EKG None  Radiology US Abdomen Limited  Result Date: 08/22/2020 CLINICAL DATA:  Right upper quadrant and epigastric pain. EXAM: ULTRASOUND ABDOMEN LIMITED RIGHT UPPER QUADRANT COMPARISON:  None. FINDINGS: Gallbladder: Physiologic lead distended. Gallstones including a large 2.9 cm stone. There is mild wall thickening at 5 mm. No pericholecystic fluid. No sonographic Murphy sign noted by sonographer. Common bile duct: Diameter: 13 mm proximally, 14 mm distally. No visualized choledocholithiasis. Liver: No focal lesion identified. Within normal limits in parenchymal echogenicity. Portal vein is patent on color Doppler imaging with normal direction of blood flow towards the liver. Other: No upper quadrant ascites. Incidental note of prominent column of Bertin in the right kidney. IMPRESSION: 1. Gallstones with mild gallbladder wall thickening. No sonographic Murphy sign. Findings  may represent acute cholecystitis in the appropriate clinical setting. 2. Dilated common bile duct at 14 mm. No visualized choledocholithiasis. MRCP could be considered for biliary tree evaluation if patient is able to tolerate breath hold technique. Electronically Signed   By: Narda Rutherford M.D.   On: 08/22/2020 00:42    Procedures Procedures   Medications Ordered in ED Medications - No data to display  ED Course  I have reviewed the triage vital signs and the nursing notes.  Pertinent labs & imaging results that were available during my care of the patient were reviewed by me and considered in my medical decision making (see chart for details).  Clinical Course as of 08/22/20 8250  Fri Aug 22, 2020  0134 Spoke with Dr. Freida Busman, general surgery. She recommended that we admit to medicine, trend LFTs, and MRCP/ERCP in the morning.  General surgery will see them in the AM. [GG]  0211  I spoke with Dr. Loney Loh who asked that I send a message to the on-call overnight gastroenterologist in case ERCP is needed in AM, but otherwise she will see and admit patient. [GG]    Clinical Course User Index [GG] Lorelee New, PA-C   MDM Rules/Calculators/A&P                          Lawrence Macdonald was evaluated in Emergency Department on 08/22/2020 for the symptoms described in the history of present illness. He was evaluated in the context of the global COVID-19 pandemic, which necessitated consideration that the patient might be at risk for infection with the SARS-CoV-2 virus that causes COVID-19. Institutional protocols and algorithms that pertain to the evaluation of patients at risk for COVID-19 are in a state of rapid change based on information released by regulatory bodies including the CDC and federal and state organizations. These policies and algorithms were followed during the patient's care in the ED.  I personally reviewed patient's medical chart and all notes from triage and staff during  today's encounter. I have also ordered and reviewed all labs and imaging that I felt to be medically necessary in the evaluation of this patient's complaints and with consideration of their physical exam. If needed, translation services were available and utilized.   Patient's history and physical exam concerning for PUD versus GERD versus pancreatitis versus gallbladder pathology.  Patient denies significant alcohol consumption.  No history of abdominal surgeries.  Does not take aspirin or other blood thinners.  Denies melena.  Will obtain CMP, lipase, UA, and CBC for better clarification.  Abdominal exam is largely benign, no peritoneal signs.  Negative Murphy sign.  Mild TTP in epigastrium.  CMP notable for significant transaminitis with AST to 365 and ALT to 444.  Alkaline phosphatase also elevated to 297 and total bilirubin of 2.5.  We will proceed with limited ultrasound of abdomen to assess for gallbladder etiology.  Ultrasound obtained reveals gallstones with mild gallbladder wall thickening.  There is a dilated common bile duct at 14 mm.  No visualized choledocholithiasis, but recommending MRCP.  Negative sonographic Murphy sign.  Will consult general surgery.  Spoke with Dr. Freida Busman, general surgery. She recommended that we admit to medicine, trend LFTs, and MRCP/ERCP in the morning.  General surgery will see them in the AM.  I spoke with Dr. Loney Loh who asked that I send a message to the on-call overnight gastroenterologist in case ERCP is needed in AM, but otherwise she will see and admit patient.   Final Clinical Impression(s) / ED Diagnoses Final diagnoses:  Calculus of gallbladder and bile duct with obstruction without cholecystitis    Rx / DC Orders ED Discharge Orders    None       Lorelee New, PA-C 08/22/20 3646    Sabas Sous, MD 08/22/20 [phone]

## 2020-08-22 NOTE — Consult Note (Signed)
Tag Wurtz 1956/03/22  [id number].    Chief Complaint/Reason for Consult: gallstones, elevated LFTs  HPI:  Mr. Lawrence Macdonald is a 65 yo male with a history of DM and HTN who presented to the ED with epigastric and RUQ abdominal pain. He has been having pain for about 2-3 weeks, and it gets worse after eating. It is associated with nausea. In the ED he was afebrile and hemodynamically stable. Labs were significant for a bilirubin of 2.5, which is up to 3.3 this morning, as well as elevated transaminases. Lipase is normal. RUQ US showed multiple large gallstones with a dilated common bile duct to 4mm. No pericholecystic fluid. Patient has not had any prior abdominal surgeries and takes no blood thinners at home.  ROS: Review of Systems  Constitutional: Positive for chills. Negative for fever.  Eyes: Negative for redness.  Respiratory: Negative for shortness of breath, wheezing and stridor.   Gastrointestinal: Positive for abdominal pain and nausea.  Neurological: Negative for weakness.  Psychiatric/Behavioral: The patient does not have insomnia.     History reviewed. No pertinent family history.  Past Medical History:  Diagnosis Date  . Diabetes mellitus without complication (HCC)   . Hypertension     Past Surgical History:  Procedure Laterality Date  . TONSILLECTOMY      Social History:  reports that he has never smoked. He has never used smokeless tobacco. He reports current alcohol use. He reports that he does not use drugs.  Allergies:  Allergies  Allergen Reactions  . Hydrochlorothiazide     Other reaction(s): gout Caused patient to have gout   . Lisinopril Other (See Comments)    cough   . Sulfadiazine Rash    (Not in a hospital admission)    Physical Exam: Blood pressure 127/74, pulse 74, temperature 98.4 F (36.9 C), temperature source Oral, resp. rate 16, SpO2 93 %. General: resting comfortably, appears stated age, no apparent distress Neurological:  alert and oriented, no focal deficits, cranial nerves grossly in tact HEENT: normocephalic, atraumatic, oropharynx clear, no scleral icterus CV: regular rate and rhythm, extremities warm and well-perfused Respiratory: normal work of breathing, symmetric chest wall expansion Abdomen: soft, nondistended, minimally tender to palpation in epigastric area. No masses or organomegaly. No surgical scars. Extremities: warm and well-perfused, no deformities, moving all extremities spontaneously Psychiatric: normal mood and affect Skin: warm and dry, no jaundice, no rashes or lesions   Results for orders placed or performed during the hospital encounter of 08/21/20 (from the past 48 hour(s))  Lipase, blood     Status: None   Collection Time: 08/21/20 10:11 PM  Result Value Ref Range   Lipase 32 11 - 51 U/L    Comment: Performed at [facility], [address], [place], [place] [address]  Comprehensive metabolic panel     Status: Abnormal   Collection Time: 08/21/20 10:11 PM  Result Value Ref Range   Sodium 139 135 - 145 mmol/L   Potassium 3.4 (L) 3.5 - 5.1 mmol/L   Chloride 100 98 - 111 mmol/L   CO2 26 22 - 32 mmol/L   Glucose, Bld 167 (H) 70 - 99 mg/dL    Comment: Glucose reference range applies only to samples taken after fasting for at least 8 hours.   BUN 17 8 - 23 mg/dL   Creatinine, Ser 8.92 0.61 - 1.24 mg/dL   Calcium 9.4 8.9 - 11.9 mg/dL   Total Protein 7.5 6.5 - 8.1 g/dL  Albumin 3.9 3.5 - 5.0 g/dL   AST 962 (H) 15 - 41 U/L   ALT 444 (H) 0 - 44 U/L   Alkaline Phosphatase 297 (H) 38 - 126 U/L   Total Bilirubin 2.5 (H) 0.3 - 1.2 mg/dL   GFR, Estimated >83 >66 mL/min    Comment: (NOTE) Calculated using the CKD-EPI Creatinine Equation (2021)    Anion gap 13 5 - 15    Comment: Performed at [facility], [address], [place], [place] [address]  CBC     Status: None   Collection Time: 08/21/20 10:11 PM  Result Value Ref Range   WBC 8.1 4.0 -  10.5 K/uL   RBC 5.28 4.22 - 5.81 MIL/uL   Hemoglobin 14.2 13.0 - 17.0 g/dL   HCT 54.6 50.3 - 54.6 %   MCV 84.7 80.0 - 100.0 fL   MCH 26.9 26.0 - 34.0 pg   MCHC 31.8 30.0 - 36.0 g/dL   RDW 56.8 12.7 - 51.7 %   Platelets 199 150 - 400 K/uL   nRBC 0.0 0.0 - 0.2 %    Comment: Performed at [facility], [address], [place], [place] [address]  Urinalysis, Routine w reflex microscopic     Status: Abnormal   Collection Time: 08/21/20 10:11 PM  Result Value Ref Range   Color, Urine AMBER (A) YELLOW    Comment: BIOCHEMICALS MAY BE AFFECTED BY COLOR   APPearance CLEAR CLEAR   Specific Gravity, Urine 1.020 1.005 - 1.030   pH 6.0 5.0 - 8.0   Glucose, UA NEGATIVE NEGATIVE mg/dL   Hgb urine dipstick NEGATIVE NEGATIVE   Bilirubin Urine NEGATIVE NEGATIVE   Ketones, ur NEGATIVE NEGATIVE mg/dL   Protein, ur NEGATIVE NEGATIVE mg/dL   Nitrite NEGATIVE NEGATIVE   Leukocytes,Ua NEGATIVE NEGATIVE    Comment: Performed at [facility], [address], [place], [place] [address]  CBG monitoring, ED     Status: Abnormal   Collection Time: 08/22/20  2:57 AM  Result Value Ref Range   Glucose-Capillary 196 (H) 70 - 99 mg/dL    Comment: Glucose reference range applies only to samples taken after fasting for at least 8 hours.  Magnesium     Status: None   Collection Time: 08/22/20  3:02 AM  Result Value Ref Range   Magnesium 2.2 1.7 - 2.4 mg/dL    Comment: Performed at [facility], [address], [place], [place] [address]  Hepatic function panel     Status: Abnormal   Collection Time: 08/22/20  3:02 AM  Result Value Ref Range   Total Protein 7.2 6.5 - 8.1 g/dL   Albumin 3.7 3.5 - 5.0 g/dL   AST 384 (H) 15 - 41 U/L   ALT 505 (H) 0 - 44 U/L   Alkaline Phosphatase 302 (H) 38 - 126 U/L   Total Bilirubin 3.3 (H) 0.3 - 1.2 mg/dL   Bilirubin, Direct 1.7 (H) 0.0 - 0.2 mg/dL   Indirect Bilirubin 1.6 (H) 0.3 - 0.9 mg/dL    Comment: Performed at  [facility], [address] [place], [place] [address]  CBC     Status: None   Collection Time: 08/22/20  3:02 AM  Result Value Ref Range   WBC 7.9 4.0 - 10.5 K/uL   RBC 5.46 4.22 - 5.81 MIL/uL   Hemoglobin 14.8 13.0 - 17.0 g/dL   HCT 35.7 01.7 - 79.3 %   MCV 84.2  80.0 - 100.0 fL   MCH 27.1 26.0 - 34.0 pg   MCHC 32.2 30.0 - 36.0 g/dL   RDW 81.7 71.1 - 65.7 %   Platelets 199 150 - 400 K/uL   nRBC 0.0 0.0 - 0.2 %    Comment: Performed at [facility], [address], [place], [place] [address]   US Abdomen Limited  Result Date: 08/22/2020 CLINICAL DATA:  Right upper quadrant and epigastric pain. EXAM: ULTRASOUND ABDOMEN LIMITED RIGHT UPPER QUADRANT COMPARISON:  None. FINDINGS: Gallbladder: Physiologic lead distended. Gallstones including a large 2.9 cm stone. There is mild wall thickening at 5 mm. No pericholecystic fluid. No sonographic Murphy sign noted by sonographer. Common bile duct: Diameter: 13 mm proximally, 14 mm distally. No visualized choledocholithiasis. Liver: No focal lesion identified. Within normal limits in parenchymal echogenicity. Portal vein is patent on color Doppler imaging with normal direction of blood flow towards the liver. Other: No upper quadrant ascites. Incidental note of prominent column of Bertin in the right kidney. IMPRESSION: 1. Gallstones with mild gallbladder wall thickening. No sonographic Murphy sign. Findings may represent acute cholecystitis in the appropriate clinical setting. 2. Dilated common bile duct at 14 mm. No visualized choledocholithiasis. MRCP could be considered for biliary tree evaluation if patient is able to tolerate breath hold technique. Electronically Signed   By: Narda Rutherford M.D.   On: 08/22/2020 00:42      Assessment/Plan 65 yo male presenting with symptomatic cholelithiasis and concern for choledocholithiasis. MRCP pending this morning for evaluation of common bile duct stones. If there is  choledocholithiasis on MRCP, recommend GI consult for ERCP. If no choledocholithiasis is present, the patient has likely already passed a stone. Continue trending LFTs. Patient will ultimately require cholecystectomy to prevent recurrent episodes. Surgery will follow.   Sophronia Simas, MD [facility] General, Hepatobiliary and Pancreatic Surgery 08/22/20 6:35 AM

## 2020-08-22 NOTE — ED Notes (Addendum)
Patient transported to MRI 

## 2020-08-23 ENCOUNTER — Observation Stay (HOSPITAL_COMMUNITY): Payer: Medicare Other

## 2020-08-23 ENCOUNTER — Observation Stay (HOSPITAL_COMMUNITY): Payer: Medicare Other | Admitting: Anesthesiology

## 2020-08-23 ENCOUNTER — Encounter (HOSPITAL_COMMUNITY): Payer: Self-pay | Admitting: Internal Medicine

## 2020-08-23 ENCOUNTER — Encounter (HOSPITAL_COMMUNITY)
Admission: EM | Disposition: A | Discharge status: Home / Self Care | Payer: Self-pay | Source: Home / Self Care | Attending: Emergency Medicine

## 2020-08-23 DIAGNOSIS — Z20822 Contact with and (suspected) exposure to covid-19: Secondary | ICD-10-CM | POA: Diagnosis not present

## 2020-08-23 DIAGNOSIS — K8064 Calculus of gallbladder and bile duct with chronic cholecystitis without obstruction: Secondary | ICD-10-CM | POA: Diagnosis not present

## 2020-08-23 DIAGNOSIS — K838 Other specified diseases of biliary tract: Secondary | ICD-10-CM | POA: Diagnosis not present

## 2020-08-23 DIAGNOSIS — I10 Essential (primary) hypertension: Secondary | ICD-10-CM | POA: Diagnosis not present

## 2020-08-23 DIAGNOSIS — E785 Hyperlipidemia, unspecified: Secondary | ICD-10-CM | POA: Diagnosis not present

## 2020-08-23 DIAGNOSIS — K8001 Calculus of gallbladder with acute cholecystitis with obstruction: Secondary | ICD-10-CM | POA: Diagnosis not present

## 2020-08-23 DIAGNOSIS — R748 Abnormal levels of other serum enzymes: Secondary | ICD-10-CM | POA: Diagnosis not present

## 2020-08-23 HISTORY — PX: CHOLECYSTECTOMY: SHX55

## 2020-08-23 LAB — CBC
HCT: 39 % (ref 39.0–52.0)
Hemoglobin: 12.5 g/dL — ABNORMAL LOW (ref 13.0–17.0)
MCH: 26.9 pg (ref 26.0–34.0)
MCHC: 32.1 g/dL (ref 30.0–36.0)
MCV: 83.9 fL (ref 80.0–100.0)
Platelets: 168 10*3/uL (ref 150–400)
RBC: 4.65 MIL/uL (ref 4.22–5.81)
RDW: 15.8 % — ABNORMAL HIGH (ref 11.5–15.5)
WBC: 6.2 10*3/uL (ref 4.0–10.5)
nRBC: 0 % (ref 0.0–0.2)

## 2020-08-23 LAB — GLUCOSE, CAPILLARY
Glucose-Capillary: 103 mg/dL — ABNORMAL HIGH (ref 70–99)
Glucose-Capillary: 108 mg/dL — ABNORMAL HIGH (ref 70–99)
Glucose-Capillary: 128 mg/dL — ABNORMAL HIGH (ref 70–99)
Glucose-Capillary: 137 mg/dL — ABNORMAL HIGH (ref 70–99)
Glucose-Capillary: 166 mg/dL — ABNORMAL HIGH (ref 70–99)
Glucose-Capillary: 185 mg/dL — ABNORMAL HIGH (ref 70–99)
Glucose-Capillary: 221 mg/dL — ABNORMAL HIGH (ref 70–99)

## 2020-08-23 LAB — COMPREHENSIVE METABOLIC PANEL
ALT: 329 U/L — ABNORMAL HIGH (ref 0–44)
AST: 135 U/L — ABNORMAL HIGH (ref 15–41)
Albumin: 3.2 g/dL — ABNORMAL LOW (ref 3.5–5.0)
Alkaline Phosphatase: 218 U/L — ABNORMAL HIGH (ref 38–126)
Anion gap: 10 (ref 5–15)
BUN: 12 mg/dL (ref 8–23)
CO2: 24 mmol/L (ref 22–32)
Calcium: 8.8 mg/dL — ABNORMAL LOW (ref 8.9–10.3)
Chloride: 106 mmol/L (ref 98–111)
Creatinine, Ser: 1.06 mg/dL (ref 0.61–1.24)
GFR, Estimated: >60 mL/min (ref >60)
Glucose, Bld: 122 mg/dL — ABNORMAL HIGH (ref 70–99)
Potassium: 3.7 mmol/L (ref 3.5–5.1)
Sodium: 140 mmol/L (ref 135–145)
Total Bilirubin: 2.7 mg/dL — ABNORMAL HIGH (ref 0.3–1.2)
Total Protein: 6.2 g/dL — ABNORMAL LOW (ref 6.5–8.1)

## 2020-08-23 LAB — SURGICAL PCR SCREEN
MRSA, PCR: NEGATIVE
Staphylococcus aureus: POSITIVE — AB

## 2020-08-23 SURGERY — LAPAROSCOPIC CHOLECYSTECTOMY WITH INTRAOPERATIVE CHOLANGIOGRAM
Anesthesia: General | Site: Abdomen

## 2020-08-23 MED ORDER — EPHEDRINE 5 MG/ML INJ
INTRAVENOUS | Status: AC
  Filled 2020-08-23: qty 10

## 2020-08-23 MED ORDER — MORPHINE SULFATE (PF) 4 MG/ML IV SOLN: 1.0000 mg | INTRAVENOUS | Status: DC | PRN

## 2020-08-23 MED ORDER — PROPOFOL 10 MG/ML IV BOLUS
INTRAVENOUS | Status: AC
  Filled 2020-08-23: qty 20

## 2020-08-23 MED ORDER — ACETAMINOPHEN 500 MG PO TABS
1000.0000 mg | ORAL_TABLET | ORAL | Status: AC
  Administered 2020-08-23: 1000 mg via ORAL
  Filled 2020-08-23: qty 2

## 2020-08-23 MED ORDER — HEPARIN SODIUM (PORCINE) 5000 UNIT/ML IJ SOLN
5000.0000 [IU] | Freq: Three times a day (TID) | INTRAMUSCULAR | Status: DC
  Administered 2020-08-23 – 2020-08-25 (×5): 5000 [IU] via SUBCUTANEOUS
  Filled 2020-08-23 (×5): qty 1

## 2020-08-23 MED ORDER — ROCURONIUM BROMIDE 10 MG/ML (PF) SYRINGE
PREFILLED_SYRINGE | INTRAVENOUS | Status: AC
  Filled 2020-08-23: qty 10

## 2020-08-23 MED ORDER — BUPIVACAINE-EPINEPHRINE (PF) 0.25% -1:200000 IJ SOLN
INTRAMUSCULAR | Status: AC
  Filled 2020-08-23: qty 30

## 2020-08-23 MED ORDER — SODIUM CHLORIDE 0.9 % IV SOLN
INTRAVENOUS | Status: DC | PRN
  Administered 2020-08-23: 15 mL

## 2020-08-23 MED ORDER — ONDANSETRON HCL 4 MG/2ML IJ SOLN
INTRAMUSCULAR | Status: AC
  Filled 2020-08-23: qty 2

## 2020-08-23 MED ORDER — ACETAMINOPHEN 325 MG PO TABS
325.0000 mg | ORAL_TABLET | Freq: Once | ORAL | Status: DC | PRN
Start: 2020-08-23 — End: 2020-08-23

## 2020-08-23 MED ORDER — HEMOSTATIC AGENTS (NO CHARGE) OPTIME
TOPICAL | Status: DC | PRN
  Administered 2020-08-23: 1 via TOPICAL

## 2020-08-23 MED ORDER — DOCUSATE SODIUM 100 MG PO CAPS
100.0000 mg | ORAL_CAPSULE | Freq: Two times a day (BID) | ORAL | Status: DC
  Administered 2020-08-23 – 2020-08-25 (×4): 100 mg via ORAL
  Filled 2020-08-23 (×4): qty 1

## 2020-08-23 MED ORDER — LACTATED RINGERS IR SOLN
Status: DC | PRN
  Administered 2020-08-23: 1000 mL

## 2020-08-23 MED ORDER — LIDOCAINE 2% (20 MG/ML) 5 ML SYRINGE
INTRAMUSCULAR | Status: DC | PRN
  Administered 2020-08-23: 60 mg via INTRAVENOUS

## 2020-08-23 MED ORDER — SUGAMMADEX SODIUM 500 MG/5ML IV SOLN
INTRAVENOUS | Status: AC
  Filled 2020-08-23: qty 5

## 2020-08-23 MED ORDER — SUGAMMADEX SODIUM 200 MG/2ML IV SOLN
INTRAVENOUS | Status: DC | PRN
  Administered 2020-08-23: 300 mg via INTRAVENOUS

## 2020-08-23 MED ORDER — FENTANYL CITRATE (PF) 250 MCG/5ML IJ SOLN
INTRAMUSCULAR | Status: DC | PRN
  Administered 2020-08-23: 100 ug via INTRAVENOUS
  Administered 2020-08-23 (×3): 50 ug via INTRAVENOUS

## 2020-08-23 MED ORDER — MEPERIDINE HCL 50 MG/ML IJ SOLN: 6.2500 mg | INTRAMUSCULAR | Status: DC | PRN

## 2020-08-23 MED ORDER — FENTANYL CITRATE (PF) 250 MCG/5ML IJ SOLN
INTRAMUSCULAR | Status: AC
  Filled 2020-08-23: qty 5

## 2020-08-23 MED ORDER — ACETAMINOPHEN 500 MG PO TABS
1000.0000 mg | ORAL_TABLET | Freq: Three times a day (TID) | ORAL | Status: DC
  Administered 2020-08-23 – 2020-08-25 (×3): 1000 mg via ORAL
  Filled 2020-08-23 (×3): qty 2

## 2020-08-23 MED ORDER — 0.9 % SODIUM CHLORIDE (POUR BTL) OPTIME
TOPICAL | Status: DC | PRN
  Administered 2020-08-23: 1000 mL

## 2020-08-23 MED ORDER — OXYCODONE HCL 5 MG PO TABS
5.0000 mg | ORAL_TABLET | ORAL | Status: DC | PRN
  Administered 2020-08-23: 5 mg via ORAL
  Administered 2020-08-24: 10 mg via ORAL
  Filled 2020-08-23: qty 2
  Filled 2020-08-23: qty 1

## 2020-08-23 MED ORDER — ACETAMINOPHEN 160 MG/5ML PO SOLN
325.0000 mg | Freq: Once | ORAL | Status: DC | PRN
Start: 2020-08-23 — End: 2020-08-23

## 2020-08-23 MED ORDER — LIDOCAINE HCL (PF) 2 % IJ SOLN
INTRAMUSCULAR | Status: AC
  Filled 2020-08-23: qty 5

## 2020-08-23 MED ORDER — DEXAMETHASONE SODIUM PHOSPHATE 10 MG/ML IJ SOLN
INTRAMUSCULAR | Status: AC
  Filled 2020-08-23: qty 1

## 2020-08-23 MED ORDER — MIDAZOLAM HCL 5 MG/5ML IJ SOLN
INTRAMUSCULAR | Status: DC | PRN
  Administered 2020-08-23: 2 mg via INTRAVENOUS

## 2020-08-23 MED ORDER — MUPIROCIN 2 % EX OINT
TOPICAL_OINTMENT | Freq: Two times a day (BID) | CUTANEOUS | Status: DC
  Administered 2020-08-23 – 2020-08-25 (×2): 1 via NASAL
  Filled 2020-08-23 (×2): qty 22

## 2020-08-23 MED ORDER — FENTANYL CITRATE (PF) 100 MCG/2ML IJ SOLN
25.0000 ug | INTRAMUSCULAR | Status: DC | PRN
  Administered 2020-08-23 (×2): 50 ug via INTRAVENOUS

## 2020-08-23 MED ORDER — FENTANYL CITRATE (PF) 100 MCG/2ML IJ SOLN
INTRAMUSCULAR | Status: AC
  Administered 2020-08-23: 50 ug via INTRAVENOUS
  Filled 2020-08-23: qty 2

## 2020-08-23 MED ORDER — ROCURONIUM BROMIDE 10 MG/ML (PF) SYRINGE
PREFILLED_SYRINGE | INTRAVENOUS | Status: DC | PRN
  Administered 2020-08-23: 60 mg via INTRAVENOUS
  Administered 2020-08-23: 40 mg via INTRAVENOUS

## 2020-08-23 MED ORDER — GABAPENTIN 100 MG PO CAPS
200.0000 mg | ORAL_CAPSULE | ORAL | Status: AC
  Administered 2020-08-23: 200 mg via ORAL
  Filled 2020-08-23: qty 2

## 2020-08-23 MED ORDER — MIDAZOLAM HCL 2 MG/2ML IJ SOLN
INTRAMUSCULAR | Status: AC
  Filled 2020-08-23: qty 2

## 2020-08-23 MED ORDER — DEXAMETHASONE SODIUM PHOSPHATE 10 MG/ML IJ SOLN
INTRAMUSCULAR | Status: DC | PRN
  Administered 2020-08-23: 4 mg via INTRAVENOUS

## 2020-08-23 MED ORDER — ACETAMINOPHEN 10 MG/ML IV SOLN: 1000.0000 mg | Freq: Once | INTRAVENOUS | Status: DC | PRN

## 2020-08-23 MED ORDER — BUPIVACAINE-EPINEPHRINE 0.25% -1:200000 IJ SOLN
INTRAMUSCULAR | Status: DC | PRN
  Administered 2020-08-23: 30 mL

## 2020-08-23 MED ORDER — AMISULPRIDE (ANTIEMETIC) 5 MG/2ML IV SOLN: 10.0000 mg | Freq: Once | INTRAVENOUS | Status: DC | PRN

## 2020-08-23 MED ORDER — MORPHINE SULFATE (PF) 2 MG/ML IV SOLN: 1.0000 mg | INTRAVENOUS | Status: DC | PRN

## 2020-08-23 MED ORDER — FENTANYL CITRATE (PF) 100 MCG/2ML IJ SOLN
INTRAMUSCULAR | Status: AC
  Filled 2020-08-23: qty 2

## 2020-08-23 MED ORDER — LACTATED RINGERS IV SOLN: INTRAVENOUS | Status: DC | PRN

## 2020-08-23 MED ORDER — PROPOFOL 10 MG/ML IV BOLUS
INTRAVENOUS | Status: DC | PRN
  Administered 2020-08-23: 130 mg via INTRAVENOUS

## 2020-08-23 MED ORDER — LACTATED RINGERS IV SOLN: INTRAVENOUS | Status: DC

## 2020-08-23 MED ORDER — ONDANSETRON HCL 4 MG/2ML IJ SOLN
INTRAMUSCULAR | Status: DC | PRN
  Administered 2020-08-23: 4 mg via INTRAVENOUS

## 2020-08-23 SURGICAL SUPPLY — 58 items
APPLICATOR ARISTA FLEXITIP XL (MISCELLANEOUS) IMPLANT
APPLIER CLIP 5 13 M/L LIGAMAX5 (MISCELLANEOUS) ×2
APPLIER CLIP ROT 10 11.4 M/L (STAPLE) ×2
BENZOIN TINCTURE PRP APPL 2/3 (GAUZE/BANDAGES/DRESSINGS) IMPLANT
BNDG ADH 1X3 SHEER STRL LF (GAUZE/BANDAGES/DRESSINGS) ×8 IMPLANT
CABLE HIGH FREQUENCY MONO STRZ (ELECTRODE) ×2 IMPLANT
CHLORAPREP W/TINT 26 (MISCELLANEOUS) ×4 IMPLANT
CLIP APPLIE 5 13 M/L LIGAMAX5 (MISCELLANEOUS) ×1 IMPLANT
CLIP APPLIE ROT 10 11.4 M/L (STAPLE) ×1 IMPLANT
CLIP VESOLOCK MED LG 6/CT (CLIP) IMPLANT
COVER MAYO STAND STRL (DRAPES) ×2 IMPLANT
COVER SURGICAL LIGHT HANDLE (MISCELLANEOUS) ×2 IMPLANT
COVER WAND RF STERILE (DRAPES) IMPLANT
DECANTER SPIKE VIAL GLASS SM (MISCELLANEOUS) ×2 IMPLANT
DERMABOND ADVANCED (GAUZE/BANDAGES/DRESSINGS)
DERMABOND ADVANCED .7 DNX12 (GAUZE/BANDAGES/DRESSINGS) IMPLANT
DRAPE C-ARM 42X120 X-RAY (DRAPES) ×2 IMPLANT
DRSG TEGADERM 2-3/8X2-3/4 SM (GAUZE/BANDAGES/DRESSINGS) ×6 IMPLANT
DRSG TEGADERM 4X4.75 (GAUZE/BANDAGES/DRESSINGS) ×2 IMPLANT
ELECT REM PT RETURN 15FT ADLT (MISCELLANEOUS) ×2 IMPLANT
ENDOLOOP SUT PDS II  0 18 (SUTURE) ×1
ENDOLOOP SUT PDS II 0 18 (SUTURE) ×1 IMPLANT
GAUZE SPONGE 2X2 8PLY STRL LF (GAUZE/BANDAGES/DRESSINGS) ×1 IMPLANT
GLOVE ECLIPSE 8.0 STRL XLNG CF (GLOVE) ×2 IMPLANT
GLOVE INDICATOR 8.0 STRL GRN (GLOVE) ×2 IMPLANT
GLOVE SURG ENC MOIS LTX SZ7 (GLOVE) ×4 IMPLANT
GLOVE SURG ENC MOIS LTX SZ7.5 (GLOVE) ×4 IMPLANT
GLOVE SURG ENC MOIS LTX SZ8 (GLOVE) ×6 IMPLANT
GLOVE SURG LTX SZ8 (GLOVE) ×2 IMPLANT
GOWN STRL REUS W/ TWL LRG LVL3 (GOWN DISPOSABLE) ×2 IMPLANT
GOWN STRL REUS W/TWL LRG LVL3 (GOWN DISPOSABLE) ×2
GOWN STRL REUS W/TWL XL LVL3 (GOWN DISPOSABLE) ×4 IMPLANT
GRASPER SUT TROCAR 14GX15 (MISCELLANEOUS) IMPLANT
HEMOSTAT ARISTA ABSORB 3G PWDR (HEMOSTASIS) IMPLANT
HEMOSTAT SNOW SURGICEL 2X4 (HEMOSTASIS) ×2 IMPLANT
KIT BASIN OR (CUSTOM PROCEDURE TRAY) ×2 IMPLANT
KIT TURNOVER KIT A (KITS) ×2 IMPLANT
L-HOOK LAP DISP 36CM (ELECTROSURGICAL)
LHOOK LAP DISP 36CM (ELECTROSURGICAL) IMPLANT
POUCH RETRIEVAL ECOSAC 10 (ENDOMECHANICALS) ×1 IMPLANT
POUCH RETRIEVAL ECOSAC 10MM (ENDOMECHANICALS) ×1
SCISSORS LAP 5X35 DISP (ENDOMECHANICALS) ×2 IMPLANT
SET CHOLANGIOGRAPH MIX (MISCELLANEOUS) ×2 IMPLANT
SET IRRIG TUBING LAPAROSCOPIC (IRRIGATION / IRRIGATOR) ×2 IMPLANT
SET TUBE SMOKE EVAC HIGH FLOW (TUBING) ×2 IMPLANT
SLEEVE XCEL OPT CAN 5 100 (ENDOMECHANICALS) ×4 IMPLANT
SPONGE GAUZE 2X2 STER 10/PKG (GAUZE/BANDAGES/DRESSINGS) ×1
STRIP CLOSURE SKIN 1/2X4 (GAUZE/BANDAGES/DRESSINGS) ×2 IMPLANT
SUT MNCRL AB 4-0 PS2 18 (SUTURE) ×2 IMPLANT
SUT VIC AB 0 UR5 27 (SUTURE) IMPLANT
SUT VICRYL 0 TIES 12 18 (SUTURE) IMPLANT
SUT VICRYL 0 UR6 27IN ABS (SUTURE) IMPLANT
TOWEL OR 17X26 10 PK STRL BLUE (TOWEL DISPOSABLE) ×2 IMPLANT
TOWEL OR NON WOVEN STRL DISP B (DISPOSABLE) ×2 IMPLANT
TRAY LAPAROSCOPIC (CUSTOM PROCEDURE TRAY) ×2 IMPLANT
TROCAR BLADELESS OPT 5 100 (ENDOMECHANICALS) ×2 IMPLANT
TROCAR XCEL BLUNT TIP 100MML (ENDOMECHANICALS) ×2 IMPLANT
TROCAR XCEL NON-BLD 11X100MML (ENDOMECHANICALS) ×2 IMPLANT

## 2020-08-23 NOTE — Progress Notes (Signed)
Patient ID: Lawrence Macdonald, male   DOB: 1955/12/12, 65 y.o.   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 Denies cardiac history, denies cp, sob/doe, angina, tia/af  Objective: Vital signs in last 24 hours: Temp:  [97.8 F (36.6 C)-98.7 F (37.1 C)] 98.7 F (37.1 C) (02/19 0621) Pulse Rate:  [57-63] 61 (02/19 0621) Resp:  [16] 16 (02/19 0621) BP: (122-141)/(66-78) 137/70 (02/19 0621) SpO2:  [97 %-100 %] 97 % (02/19 0621) Last BM Date: 08/21/20  Intake/Output from previous day: 02/18 0701 - 02/19 0700 In: 1197.7 [P.O.:592; I.V.:505.7; IV Piggyback:100] Out: -  Intake/Output this shift: No intake/output data recorded.  Lungs: cta, nonlabored  Cardiovascular: reg  Abd: soft, some upper TTP, no rebound/guarding  Extremities: no edema, +SCDs  Neuro: alert, nonfocal  Lab Results: CBC  Recent Labs    08/22/20 0302 08/23/20 0437  WBC 7.9 6.2  HGB 14.8 12.5*  HCT 46.0 39.0  PLT 199 168   BMET Recent Labs    08/21/20 2211 08/23/20 0437  NA 139 140  K 3.4* 3.7  CL 100 106  CO2 26 24  GLUCOSE 167* 122*  BUN 17 12  CREATININE 1.17 1.06  CALCIUM 9.4 8.8*   LFT Hepatic Function Latest Ref Rng & Units 08/23/2020 08/22/2020 08/21/2020  Total Protein 6.5 - 8.1 g/dL 6.2(L) 7.2 7.5  Albumin 3.5 - 5.0 g/dL 3.2(L) 3.7 3.9  AST 15 - 41 U/L 135(H) 400(H) 365(H)  ALT 0 - 44 U/L 329(H) 505(H) 444(H)  Alk Phosphatase 38 - 126 U/L 218(H) 302(H) 297(H)  Total Bilirubin 0.3 - 1.2 mg/dL 2.7(H) 3.3(H) 2.5(H)  Bilirubin, Direct 0.0 - 0.2 mg/dL - 1.7(H) -   PT/INR No results for input(s): LABPROT, INR in the last 72 hours. ABG No results for input(s): PHART, HCO3 in the last 72 hours.  Invalid input(s): PCO2, PO2  Studies/Results:  Anti-infectives: Anti-infectives (From admission, onward)   Start     Dose/Rate  Route Frequency Ordered Stop   08/22/20 0315  cefTRIAXone (ROCEPHIN) 2 g in sodium chloride 0.9 % 100 mL IVPB        2 g 200 mL/hr over 30 Minutes Intravenous Every 24 hours 08/22/20 0303     08/22/20 0315  metroNIDAZOLE (FLAGYL) IVPB 500 mg        500 mg 100 mL/hr over 60 Minutes Intravenous Every 8 hours 08/22/20 0303        Medications: Scheduled Meds: . acetaminophen  1,000 mg Oral On Call to OR  . amLODipine  10 mg Oral Daily  . gabapentin  200 mg Oral On Call to OR  . heparin  5,000 Units Subcutaneous Q8H  . hydrALAZINE  50 mg Oral BID  . influenza vaccine adjuvanted  0.5 mL Intramuscular Tomorrow-1000  . insulin aspart  0-9 Units Subcutaneous Q4H  . irbesartan  300 mg Oral Daily   Continuous Infusions: . cefTRIAXone (ROCEPHIN)  IV 2 g (08/23/20 0314)  . lactated ringers 125 mL/hr at 08/22/20 1056  . metronidazole 500 mg (08/23/20 0430)   PRN Meds:.morphine injection, ondansetron (ZOFRAN) IV  Assessment/Plan: Patient Active Problem List   Diagnosis Date Noted  . Cholelithiasis 08/22/2020  . Elevated liver enzymes 08/22/2020  . Hypokalemia 08/22/2020  . HTN (hypertension) 08/22/2020  . HLD (hyperlipidemia) 08/22/2020   His LFTs are trending down so I think it  is reasonable to proceed with lap chole with ioc today  Discussed that we may not be able to technically perform IOC due to potential chronic inflammation in the area. Also discussed potential subtotal/fenestrated cholecystectomy and need for drain if inflammation to dense.   I believe the patient's symptoms are consistent with gallbladder disease.  We discussed gallbladder disease.   I discussed laparoscopic cholecystectomy with IOC in detail.  The patient was shown diagrams detailing the procedure.  We discussed the risks and benefits of a laparoscopic cholecystectomy including, but not limited to bleeding, infection, injury to surrounding structures such as the intestine or liver, bile leak, retained  gallstones, need to convert to an open procedure, prolonged diarrhea, blood clots such as  DVT, common bile duct injury, anesthesia risks, and possible need for additional procedures.  We discussed the typical post-operative recovery course. I explained that the likelihood of improvement of their symptoms is good. Explained his incision at umbilicus and muscle closure would be a little larger than average due to size of his gallstone of around 3 cm.   Discussed the typical hospitalization. Discussed that if IOC + and/or LFTs significantly bump after surgery may need ercp  All questions asked and answered On IV abx preop tylenol/gaba  Disposition:  LOS: 0 days    Lawrence Macdonald. Lawrence Pulling, MD, FACS General, Bariatric, & Minimally Invasive Surgery [phone] [facility]

## 2020-08-23 NOTE — Anesthesia Preprocedure Evaluation (Addendum)
Anesthesia Evaluation  Patient identified by MRN, date of birth, ID band Patient awake    Reviewed: Allergy & Precautions, NPO status , Patient's Chart, lab work & pertinent test results  Airway Mallampati: II  TM Distance: >3 FB Neck ROM: Full    Dental  (+) Edentulous Upper, Dental Advisory Given   Pulmonary neg pulmonary ROS,    breath sounds clear to auscultation       Cardiovascular hypertension, Pt. on medications  Rhythm:Regular Rate:Normal     Neuro/Psych negative neurological ROS  negative psych ROS   GI/Hepatic negative GI ROS, Neg liver ROS,   Endo/Other  diabetes, Type 2, Oral Hypoglycemic Agents  Renal/GU negative Renal ROS     Musculoskeletal negative musculoskeletal ROS (+)   Abdominal Normal abdominal exam  (+)   Peds  Hematology negative hematology ROS (+)   Anesthesia Other Findings   Reproductive/Obstetrics                            Anesthesia Physical Anesthesia Plan  ASA: II  Anesthesia Plan: General   Post-op Pain Management:    Induction: Intravenous  PONV Risk Score and Plan: 3 and Ondansetron, Dexamethasone and Midazolam  Airway Management Planned: Oral ETT  Additional Equipment: None  Intra-op Plan:   Post-operative Plan: Extubation in OR  Informed Consent: I have reviewed the patients History and Physical, chart, labs and discussed the procedure including the risks, benefits and alternatives for the proposed anesthesia with the patient or authorized representative who has indicated his/her understanding and acceptance.     Dental advisory given  Plan Discussed with: CRNA  Anesthesia Plan Comments:        Anesthesia Quick Evaluation

## 2020-08-23 NOTE — Progress Notes (Signed)
Lawrence Macdonald 8:40 AM  Subjective: Patient doing better and agree with plans for surgery and answered all of his and his significant other's questions and discussed hopefully Intra-Op cholangiogram and if positive ERCP  Objective: Vital signs stable afebrile abdomen is soft significantly decreased tenderness LFTs decreasing CBC okay  Assessment: Questionably passed CBD stone  Plan: Await surgical findings and hopefully IOC and will be on standby to proceed with ERCP if needed tomorrow  [facility]  office [phone] After 5PM or if no answer call [phone]

## 2020-08-23 NOTE — Progress Notes (Signed)
PROGRESS NOTE    Patient: Lawrence Macdonald                            PCP: Patient, No Pcp Per                    DOB: 1956-03-21            DOA: 08/21/2020 [id number]             DOS: 08/23/2020, 10:49 AM   LOS: 0 days   Date of Service: The patient was seen and examined on 08/23/2020  Subjective:   The patient was seen and examined this morning, stable in no acute distress, reporting improved pain with pain medication. N.p.o General surgery at bedside explaining surgical procedure and cholecystectomy Wife present at bedside.  Brief Narrative:   Lawrence Macdonald is a 65 y.o. male with medical history significant of hypertension, hyperlipidemia, non-insulin-dependent type 2 diabetes presenting to the ED with complaints of abdominal pain and nausea. AST 365, ALT 444, alk phos 297, T bili 2.5.  Lipase normal.   UA without signs of infection.   Screening SARS-CoV-2 PCR test  Right upper quadrant ultrasound showing gallstones with mild gallbladder wall thickening but no sonographic Murphy sign.  Also showing dilated CBD of 14 mm but no visualized choledocholithiasis    Assessment & Plan:   Principal Problem:   Cholelithiasis Active Problems:   Elevated liver enzymes   Hypokalemia   HTN (hypertension)   HLD (hyperlipidemia)    Cholelithiasis with concern for possible choledocholithiasis  Elevated liver enzymes -Improved abdominal pain, responding to analgesics -MRCP consistent with cholelithiasis no obvious choledocholithiasis -currently no stones identified in CBD, only in gallbladder about 3 cm -GI following, for possible follow-up ERCP -N.p.o. -General surgery planning for cholecystectomy   Right upper quadrant ultrasound showing gallstones with mild gallbladder wall thickening and dilated CBD of 14 mm but no visualized choledocholithiasis.   -Monitoring LFTs, --continue to improve -Liver enzymes are elevated: Worsening -avoiding hepatotoxins  AST 365 >> 400 >>> 135,    ALT 444 >> 505 >> 329, Alk. phos 297 >> 302 >> 218,  T bili 2.5. >>  3.3 >> 2.7 - Lipase normal. -Legal GI following  -We will continue gentle IV fluid hydration, antibiotics-ceftriaxone/Flagyl   Mild hypokalemia: Potassium 3.4. -Monitoring -Monitor potassium and magnesium levels, replenish as needed.  Hypertension:  Blood pressure slightly elevated to systolic in the 948-546 range. -Resume home antihypertensives including amlodipine, hydralazine, and irbesartan. -As needed hydralazine -Stable  Hyperlipidemia -Holding statins -due to elevated LFTs  Non-insulin-dependent type 2 diabetes -Hold home Metformin.   -  A1c.  Sliding scale insulin sensitive every 4 hours.  DVT prophylaxis: Subcutaneous heparin Code Status: Full code Family Communication: No family available at this time. Disposition Plan: Status is: Observation  The patient remains OBS appropriate and will d/c before 2 midnights.  Dispo: The patient is from: Home  Anticipated d/c is to: Home  Anticipated d/c date is: 2 days  Patient currently is not medically stable to d/c.              Difficult to place patient No  Level of care: Level of care: Med-Surg      ----------------------------------------------------------------------------------------------------------------------------------- Cultures; None    Antimicrobials: IV Rocephin/Flagyl   Consultants: General surgery/gastroenterologist  Level of care: Med-Surg   Procedures:   No admission procedures for hospital encounter.     Antimicrobials:  Anti-infectives (  From admission, onward)   Start     Dose/Rate Route Frequency Ordered Stop   08/22/20 0315  [MAR Hold]  cefTRIAXone (ROCEPHIN) 2 g in sodium chloride 0.9 % 100 mL IVPB        (MAR Hold since Sat 08/23/2020 at 1017.Hold Reason: Transfer to a Procedural area.)   2 g 200 mL/hr over 30 Minutes Intravenous Every 24 hours 08/22/20 0303      08/22/20 0315  [MAR Hold]  metroNIDAZOLE (FLAGYL) IVPB 500 mg        (MAR Hold since Sat 08/23/2020 at 1017.Hold Reason: Transfer to a Procedural area.)   500 mg 100 mL/hr over 60 Minutes Intravenous Every 8 hours 08/22/20 0303         Medication:  . [MAR Hold] amLODipine  10 mg Oral Daily  . [MAR Hold] heparin  5,000 Units Subcutaneous Q8H  . [MAR Hold] hydrALAZINE  50 mg Oral BID  . influenza vaccine adjuvanted  0.5 mL Intramuscular Tomorrow-1000  . [MAR Hold] insulin aspart  0-9 Units Subcutaneous Q4H  . [MAR Hold] irbesartan  300 mg Oral Daily    0.9 % irrigation (POUR BTL), [MAR Hold]  morphine injection, [MAR Hold] ondansetron (ZOFRAN) IV   Objective:   Vitals:   08/22/20 2045 08/22/20 2047 08/22/20 2357 08/23/20 0621  BP:  133/78 122/74 137/70  Pulse: 61 60 (!) 57 61  Resp: '16  16 16  ' Temp: 98.6 F (37 C)  98 F (36.7 C) 98.7 F (37.1 C)  TempSrc: Oral  Oral Oral  SpO2: 99% 100% 98% 97%  Weight:      Height:        Intake/Output Summary (Last 24 hours) at 08/23/2020 1049 Last data filed at 08/22/2020 1600 Gross per 24 hour  Intake 1197.73 ml  Output --  Net 1197.73 ml   Filed Weights   08/22/20 0808  Weight: 115.7 kg     Examination:      Physical Exam:   General:  Alert, oriented, cooperative, no distress;   HEENT:  Normocephalic, PERRL, otherwise with in Normal limits   Neuro:  CNII-XII intact. , normal motor and sensation, reflexes intact   Lungs:   Clear to auscultation BL, Respirations unlabored, no wheezes / crackles  Cardio:    S1/S2, RRR, No murmure, No Rubs or Gallops   Abdomen:   Soft, right upper quadrant tenderness, midepigastric tenderness mild  , bowel sounds active all four quadrants,  no guarding or peritoneal signs.  Muscular skeletal:  Limited exam - in bed, able to move all 4 extremities, Normal strength,  2+ pulses,  symmetric, No pitting edema  Skin:  Dry, warm to touch, negative for any Rashes,  Wounds: Please see nursing  documentation           LABs:  CBC Latest Ref Rng & Units 08/23/2020 08/22/2020 08/21/2020  WBC 4.0 - 10.5 K/uL 6.2 7.9 8.1  Hemoglobin 13.0 - 17.0 g/dL 12.5(L) 14.8 14.2  Hematocrit 39.0 - 52.0 % 39.0 46.0 44.7  Platelets 150 - 400 K/uL 168 199 199   CMP Latest Ref Rng & Units 08/23/2020 08/22/2020 08/21/2020  Glucose 70 - 99 mg/dL 122(H) - 167(H)  BUN 8 - 23 mg/dL 12 - 17  Creatinine 0.61 - 1.24 mg/dL 1.06 - 1.17  Sodium 135 - 145 mmol/L 140 - 139  Potassium 3.5 - 5.1 mmol/L 3.7 - 3.4(L)  Chloride 98 - 111 mmol/L 106 - 100  CO2 22 - 32 mmol/L 24 -  26  Calcium 8.9 - 10.3 mg/dL 8.8(L) - 9.4  Total Protein 6.5 - 8.1 g/dL 6.2(L) 7.2 7.5  Total Bilirubin 0.3 - 1.2 mg/dL 2.7(H) 3.3(H) 2.5(H)  Alkaline Phos 38 - 126 U/L 218(H) 302(H) 297(H)  AST 15 - 41 U/L 135(H) 400(H) 365(H)  ALT 0 - 44 U/L 329(H) 505(H) 444(H)       Micro Results Recent Results (from the past 240 hour(s))  SARS CORONAVIRUS 2 (TAT 6-24 HRS) Nasopharyngeal Nasopharyngeal Swab     Status: None   Collection Time: 08/22/20  2:04 AM   Specimen: Nasopharyngeal Swab  Result Value Ref Range Status   SARS Coronavirus 2 NEGATIVE NEGATIVE Final    Comment: (NOTE) SARS-CoV-2 target nucleic acids are NOT DETECTED.  The SARS-CoV-2 RNA is generally detectable in upper and lower respiratory specimens during the acute phase of infection. Negative results do not preclude SARS-CoV-2 infection, do not rule out co-infections with other pathogens, and should not be used as the sole basis for treatment or other patient management decisions. Negative results must be combined with clinical observations, patient history, and epidemiological information. The expected result is Negative.  Fact Sheet for Patients: SugarRoll.be  Fact Sheet for Healthcare Providers: https://www.woods-mathews.com/  This test is not yet approved or cleared by the [place] FDA and  has been authorized for  detection and/or diagnosis of SARS-CoV-2 by FDA under an Emergency Use Authorization (EUA). This EUA will remain  in effect (meaning this test can be used) for the duration of the COVID-19 declaration under Se ction 564(b)(1) of the Act, 21 U.S.C. section 360bbb-3(b)(1), unless the authorization is terminated or revoked sooner.  Performed at [facility], [place] [address], [place], [place] [address]   Surgical pcr screen     Status: Abnormal   Collection Time: 08/23/20  8:31 AM   Specimen: Nasal Mucosa; Nasal Swab  Result Value Ref Range Status   MRSA, PCR NEGATIVE NEGATIVE Final   Staphylococcus aureus POSITIVE (A) NEGATIVE Final    Comment: (NOTE) The Xpert SA Assay (FDA approved for NASAL specimens in patients 1 years of age and older), is one component of a comprehensive surveillance program. It is not intended to diagnose infection nor to guide or monitor treatment. Performed at [facility], [place] [address], [place], [place] [address]     Radiology Reports MR 3D Recon At Scanner  Addendum Date: 08/22/2020   ADDENDUM REPORT: 08/22/2020 10:30 ADDENDUM: The original report was by Dr. Van Clines. The following addendum is by Dr. Van Clines: I discussed this case with Dr. Lizbeth Bark at 10:20 a.m. on 08/22/2020. Our discussion revolved around the appearance of the distal CBD on image 17 of series 11, where the conical tapering of the duct appears slightly truncated in a fashion that can sometimes indicate a stone. Unfortunately this image is limited by volume averaging, and the diagnostic thin-section MRCP images in this region are highly indistinct/obscured by motion artifact. In my original analysis of this case, I analyzed the axial images as a tie breaker in assessing this region, and the thin AP diameter of the distal CBD at about 2 mm in the lack of a well-defined filling defect as well as the motion artifact lead me to more conservatively  indicate that no well-defined filling defect was identified. That said, the presence of small distal CBD stones is difficult to confidently exclude, and if based on complementary clinical indicators there is a high suspicion, procedural intervention for further characterization  and potentially treatment might be appropriate. Electronically Signed   By: Van Clines M.D.   On: 08/22/2020 10:30   Result Date: 08/22/2020 CLINICAL DATA:  Right upper quadrant and epigastric pain for greater than 1 month, increasing. Ultrasound showed bile duct dilatation along with cholelithiasis. EXAM: MRI ABDOMEN WITHOUT AND WITH CONTRAST (INCLUDING MRCP) TECHNIQUE: Multiplanar multisequence MR imaging of the abdomen was performed both before and after the administration of intravenous contrast. Heavily T2-weighted images of the biliary and pancreatic ducts were obtained, and three-dimensional MRCP images were rendered by post processing. CONTRAST:  97m GADAVIST GADOBUTROL 1 MMOL/ML IV SOLN COMPARISON:  08/22/2020 ultrasound FINDINGS: Despite efforts by the technologist and patient, motion artifact is present on today's exam and could not be eliminated. This reduces exam sensitivity and specificity. Lower chest: Unremarkable suspected cardiomegaly. Otherwise unremarkable. Hepatobiliary: Multiple gallstones in the gallbladder measuring up to 2.6 cm in diameter. Gallbladder wall thickening is present. The common hepatic duct measures up to 0.8 cm for example on image 49 of series 6, with the confluence of the cystic duct and common hepatic duct well seen on image 22 of series 15. The common bile duct measures up to 0.6 cm in diameter for example on images 22-23 of series 15. I not see a well-defined filling defect in the common bile duct, although the dedicated MRCP images are blurred by motion artifact. There is a suggestion of mild periportal edema. Clustered likely reactive lymph nodes in the porta hepatis and peripancreatic  region including a 1.0 cm portacaval lymph node. Pancreas:  Unremarkable Spleen:  Unremarkable Adrenals/Urinary Tract: The adrenal glands appear unremarkable. 1.1 cm fluid signal intensity lesion of the right kidney lower pole laterally compatible with cyst. Stomach/Bowel: Scattered diverticula of the descending colon. Vascular/Lymphatic:  Reactive lymph nodes in the porta hepatis. Other:  Trace perihepatic ascites. Musculoskeletal: There is evidence of lumbar spondylosis and degenerative disc disease, with suspicion for central narrowing of the thecal sac at L1-2 and L2-3. IMPRESSION: 1. Cholelithiasis with gallbladder wall thickening and pericholecystic edema. Correlate clinically in assessing for acute cholecystitis. 2. Mild dilatation of the common hepatic duct, with the common bile duct currently within normal limits at 0.6 cm in diameter. No definite filling defect in the CBD, although motion artifact reduces sensitivity. 3. Clustered likely reactive lymph nodes in the porta hepatis and peripancreatic region including a 1.0 cm portacaval lymph node. 4. Despite efforts by the technologist and patient, motion artifact is present on today's exam and could not be eliminated. This reduces exam sensitivity and specificity. 5. Scattered diverticula of the descending colon. 6. Trace perihepatic ascites. 7. Lumbar spondylosis and degenerative disc disease, with suspicion for central narrowing of the thecal sac at L1-2 and L2-3. Electronically Signed: By: WVan ClinesM.D. On: 08/22/2020 07:49   [place]domen Limited  Result Date: 08/22/2020 CLINICAL DATA:  Right upper quadrant and epigastric pain. EXAM: ULTRASOUND ABDOMEN LIMITED RIGHT UPPER QUADRANT COMPARISON:  None. FINDINGS: Gallbladder: Physiologic lead distended. Gallstones including a large 2.9 cm stone. There is mild wall thickening at 5 mm. No pericholecystic fluid. No sonographic Murphy sign noted by sonographer. Common bile duct: Diameter: 13 mm  proximally, 14 mm distally. No visualized choledocholithiasis. Liver: No focal lesion identified. Within normal limits in parenchymal echogenicity. Portal vein is patent on color Doppler imaging with normal direction of blood flow towards the liver. Other: No upper quadrant ascites. Incidental note of prominent column of Bertin in the right kidney. IMPRESSION: 1. Gallstones with mild gallbladder wall  thickening. No sonographic Murphy sign. Findings may represent acute cholecystitis in the appropriate clinical setting. 2. Dilated common bile duct at 14 mm. No visualized choledocholithiasis. MRCP could be considered for biliary tree evaluation if patient is able to tolerate breath hold technique. Electronically Signed   By: Keith Rake M.D.   On: 08/22/2020 00:42   MR ABDOMEN MRCP W WO CONTAST  Addendum Date: 08/22/2020   ADDENDUM REPORT: 08/22/2020 10:30 ADDENDUM: The original report was by Dr. Van Clines. The following addendum is by Dr. Van Clines: I discussed this case with Dr. Lizbeth Bark at 10:20 a.m. on 08/22/2020. Our discussion revolved around the appearance of the distal CBD on image 17 of series 11, where the conical tapering of the duct appears slightly truncated in a fashion that can sometimes indicate a stone. Unfortunately this image is limited by volume averaging, and the diagnostic thin-section MRCP images in this region are highly indistinct/obscured by motion artifact. In my original analysis of this case, I analyzed the axial images as a tie breaker in assessing this region, and the thin AP diameter of the distal CBD at about 2 mm in the lack of a well-defined filling defect as well as the motion artifact lead me to more conservatively indicate that no well-defined filling defect was identified. That said, the presence of small distal CBD stones is difficult to confidently exclude, and if based on complementary clinical indicators there is a high suspicion, procedural  intervention for further characterization and potentially treatment might be appropriate. Electronically Signed   By: Van Clines M.D.   On: 08/22/2020 10:30   Result Date: 08/22/2020 CLINICAL DATA:  Right upper quadrant and epigastric pain for greater than 1 month, increasing. Ultrasound showed bile duct dilatation along with cholelithiasis. EXAM: MRI ABDOMEN WITHOUT AND WITH CONTRAST (INCLUDING MRCP) TECHNIQUE: Multiplanar multisequence MR imaging of the abdomen was performed both before and after the administration of intravenous contrast. Heavily T2-weighted images of the biliary and pancreatic ducts were obtained, and three-dimensional MRCP images were rendered by post processing. CONTRAST:  23m GADAVIST GADOBUTROL 1 MMOL/ML IV SOLN COMPARISON:  08/22/2020 ultrasound FINDINGS: Despite efforts by the technologist and patient, motion artifact is present on today's exam and could not be eliminated. This reduces exam sensitivity and specificity. Lower chest: Unremarkable suspected cardiomegaly. Otherwise unremarkable. Hepatobiliary: Multiple gallstones in the gallbladder measuring up to 2.6 cm in diameter. Gallbladder wall thickening is present. The common hepatic duct measures up to 0.8 cm for example on image 49 of series 6, with the confluence of the cystic duct and common hepatic duct well seen on image 22 of series 15. The common bile duct measures up to 0.6 cm in diameter for example on images 22-23 of series 15. I not see a well-defined filling defect in the common bile duct, although the dedicated MRCP images are blurred by motion artifact. There is a suggestion of mild periportal edema. Clustered likely reactive lymph nodes in the porta hepatis and peripancreatic region including a 1.0 cm portacaval lymph node. Pancreas:  Unremarkable Spleen:  Unremarkable Adrenals/Urinary Tract: The adrenal glands appear unremarkable. 1.1 cm fluid signal intensity lesion of the right kidney lower pole laterally  compatible with cyst. Stomach/Bowel: Scattered diverticula of the descending colon. Vascular/Lymphatic:  Reactive lymph nodes in the porta hepatis. Other:  Trace perihepatic ascites. Musculoskeletal: There is evidence of lumbar spondylosis and degenerative disc disease, with suspicion for central narrowing of the thecal sac at L1-2 and L2-3. IMPRESSION: 1. Cholelithiasis with gallbladder wall  thickening and pericholecystic edema. Correlate clinically in assessing for acute cholecystitis. 2. Mild dilatation of the common hepatic duct, with the common bile duct currently within normal limits at 0.6 cm in diameter. No definite filling defect in the CBD, although motion artifact reduces sensitivity. 3. Clustered likely reactive lymph nodes in the porta hepatis and peripancreatic region including a 1.0 cm portacaval lymph node. 4. Despite efforts by the technologist and patient, motion artifact is present on today's exam and could not be eliminated. This reduces exam sensitivity and specificity. 5. Scattered diverticula of the descending colon. 6. Trace perihepatic ascites. 7. Lumbar spondylosis and degenerative disc disease, with suspicion for central narrowing of the thecal sac at L1-2 and L2-3. Electronically Signed: By: Van Clines M.D. On: 08/22/2020 07:49    SIGNED: Deatra James, MD, FHM. [facility],  Pager (please use [email or web address] to page/text) Please use Epic Secure Chat for non-urgent communication (7AM-7PM)  If 7PM-7AM, please contact night-coverage [email or web address], 08/23/2020, 10:49 AM

## 2020-08-23 NOTE — Anesthesia Procedure Notes (Signed)
Procedure Name: Intubation Date/Time: 08/23/2020 10:27 AM Performed by: Mitzie Na, CRNA Pre-anesthesia Checklist: Patient identified, Emergency Drugs available, Suction available and Patient being monitored Patient Re-evaluated:Patient Re-evaluated prior to induction Oxygen Delivery Method: Circle system utilized Preoxygenation: Pre-oxygenation with 100% oxygen Induction Type: IV induction Ventilation: Two handed mask ventilation required and Oral airway inserted - appropriate to patient size Laryngoscope Size: Mac and 4 Grade View: Grade II Tube type: Oral Tube size: 7.5 mm Number of attempts: 1 Airway Equipment and Method: Stylet and Oral airway Placement Confirmation: ETT inserted through vocal cords under direct vision,  positive ETCO2 and breath sounds checked- equal and bilateral Secured at: 25 cm Tube secured with: Tape Dental Injury: Teeth and Oropharynx as per pre-operative assessment

## 2020-08-23 NOTE — Anesthesia Postprocedure Evaluation (Signed)
Anesthesia Post Note  Patient: Lawrence Macdonald  Procedure(s) Performed: LAPAROSCOPIC CHOLECYSTECTOMY WITH INTRAOPERATIVE CHOLANGIOGRAM (N/A Abdomen)     Patient location during evaluation: PACU Anesthesia Type: General Level of consciousness: awake and alert Pain management: pain level controlled Vital Signs Assessment: post-procedure vital signs reviewed and stable Respiratory status: spontaneous breathing, nonlabored ventilation, respiratory function stable and patient connected to nasal cannula oxygen Cardiovascular status: blood pressure returned to baseline and stable Postop Assessment: no apparent nausea or vomiting Anesthetic complications: no   No complications documented.  Last Vitals:  Vitals:   08/23/20 1400 08/23/20 1427  BP: (!) 152/87 (!) 150/87  Pulse: 68 72  Resp: 12 14  Temp:  36.5 C  SpO2: 96% 98%    Last Pain:  Vitals:   08/23/20 1427  TempSrc: Oral  PainSc:                  Shelton Silvas

## 2020-08-23 NOTE — Op Note (Signed)
Lawrence Macdonald [id number] 28-Jan-1956 08/23/2020  Laparoscopic Cholecystectomy with IOC Procedure Note  Indications: This patient presents with symptomatic gallbladder disease and will undergo laparoscopic cholecystectomy.  There was also concerned the patient may have choledocholithiasis.  He had a dilated bile duct on preoperative imaging and his LFTs were elevated.  Gastroenterology has been consulted.  The decision was made that if his LFTs were decreasing we would proceed with cholecystectomy with cholangiogram.  This morning his LFTs started trending down.  His preoperative MRCP suggested a distal common bile duct stone however it was inconclusive  Pre-operative Diagnosis: Symptomatic cholelithiasis possible choledocholithiasis elevated LFTs  Post-operative Diagnosis: Chronic calculus cholecystitis, choledocholithiasis  Surgeon: Gaynelle Adu MD FACS  Assistants: Marca Ancona MD FACS (an assistant was requested and needed due to the chronic inflammation around the infundibulum, need to help identify anatomy, mobilize tissue)  Anesthesia: General endotracheal anesthesia  Procedure Details  The patient was seen again in the Holding Room. The risks, benefits, complications, treatment options, and expected outcomes were discussed with the patient. The possibilities of reaction to medication, pulmonary aspiration, perforation of viscus, bleeding, recurrent infection, finding a normal gallbladder, the need for additional procedures, failure to diagnose a condition, the possible need to convert to an open procedure, and creating a complication requiring transfusion or operation were discussed with the patient. The likelihood of improving the patient's symptoms with return to their baseline status is good.  The patient and/or family concurred with the proposed plan, giving informed consent. The site of surgery properly noted. The patient was taken to Operating Room, identified as Lawrence Macdonald and the  procedure verified as Laparoscopic Cholecystectomy with Intraoperative Cholangiogram. A Time Out was held and the above information confirmed. Antibiotic prophylaxis was administered.   Prior to the induction of general anesthesia, we confirmed the patient was on scheduled therapeutic antibiotics. General endotracheal anesthesia was then administered and tolerated well. After the induction, the abdomen was prepped with Chloraprep and draped in the sterile fashion. The patient was positioned in the supine position.  Local anesthetic agent was injected into the skin near the umbilicus and an incision made. We dissected down to the abdominal fascia with blunt dissection.  The fascia was incised vertically and we entered the peritoneal cavity bluntly.  A pursestring suture of 0-Vicryl was placed around the fascial opening.  The Hasson cannula was inserted and secured with the stay suture.  Pneumoperitoneum was then created with CO2 and tolerated well without any adverse changes in the patient's vital signs. An 5-mm port was placed in the subxiphoid position.  Two 5-mm ports were placed in the right upper quadrant. All skin incisions were infiltrated with a local anesthetic agent before making the incision and placing the trocars.   We positioned the patient in reverse Trendelenburg, tilted slightly to the patient's left.  The gallbladder was identified, the fundus grasped and retracted cephalad.  There was a fair amount of omentum tethered to the body and fundus of the gallbladder.  This was taken down both bluntly as well with hook electrocautery where needed.  Adhesions were lysed bluntly and with the electrocautery where indicated, taking care not to injure any adjacent organs or viscus. The infundibulum was grasped and retracted laterally, exposing the peritoneum overlying the triangle of Calot. This was then divided and exposed in a blunt fashion however this did take some time.  There was fairly dense  chronic inflammation in this area.  We worked both medially as well as posteriorly.  Identified the  node of Calot. It took some time but we were able to achieve A critical view of the cystic duct and cystic artery was obtained.  In order to facilitate more identification and a longer target area for the cystic duct I went ahead and took the cystic artery.  2 clips were placed proximally on the cystic artery and 1 as it entered the gallbladder and then transected with EndoShears.  The cystic duct had been clearly identified and bluntly dissected circumferentially.  It was fairly dilated.  It was the only structure entering the gallbladder.  Because it was dilated I ended up enlarging and exchanging my subxiphoid 5 mm trocar to a 10 mm trocar so I could place a 10 mm clip across the distal cystic duct as it entered the gallbladder.     An incision was made in the cystic duct and the [facility] cholangiogram catheter introduced. The catheter was secured using a clip. A cholangiogram was then obtained which showed good visualization of the distal and proximal biliary tree with no sign of obstruction however there did appear to be to distal common bile duct stones.  contrast flowed easily into the duodenum.  I repeated the cholangiogram however it was more obvious on the first run the probable 2 distal common bile duct stones. the catheter was then removed.   The cystic duct was then ligated with clips and divided.  I did place a PDS Endoloop around the cystic duct stump since the cystic duct stump was quite dilated.     The gallbladder was dissected from the liver bed in retrograde fashion with the electrocautery. The gallbladder was removed and placed in an Ecco sac.  The gallbladder and Ecco sac were then removed through the umbilical port site. The liver bed was irrigated and inspected. Hemostasis was achieved with the electrocautery. Copious irrigation was utilized and was repeatedly aspirated until clear.  I did  place a piece of surgical snow in the gallbladder fossa.  the pursestring suture was used to close the umbilical fascia.  Because I did have to stretch the umbilical fascia in order to get the gallbladder out I placed 2 additional interrupted 0 Vicryls with the PMI suture passer with laparoscopic guidance.  We again inspected the right upper quadrant for hemostasis.  The umbilical closure was inspected and there was no air leak and nothing trapped within the closure. Pneumoperitoneum was released as we removed the trocars.  4-0 Monocryl was used to close the skin.   steri-strips, and clean dressings were applied. The patient was then extubated and brought to the recovery room in stable condition. Instrument, sponge, and needle counts were correct at closure and at the conclusion of the case.   Findings: Chronic probable acute Cholecystitis with Cholelithiasis IOC findings as above +critical view +snow  Estimated Blood Loss: less than 50 mL         Drains: none         Specimens: Gallbladder           Complications: None; patient tolerated the procedure well.         Disposition: PACU - hemodynamically stable.  Discussed the case with gastroenterology.  He will reviewed the intraoperative cholangiogram tentatively planning ERCP tomorrow         Condition: stable  Mary Sella. Andrey Campanile, MD, FACS General, Bariatric, & Minimally Invasive Surgery [facility], [place]

## 2020-08-23 NOTE — Transfer of Care (Signed)
Immediate Anesthesia Transfer of Care Note  Patient: Lawrence Macdonald  Procedure(s) Performed: LAPAROSCOPIC CHOLECYSTECTOMY WITH INTRAOPERATIVE CHOLANGIOGRAM (N/A Abdomen)  Patient Location: PACU  Anesthesia Type:General  Level of Consciousness: awake, alert , oriented and patient cooperative  Airway & Oxygen Therapy: Patient Spontanous Breathing and Patient connected to face mask oxygen  Post-op Assessment: Report given to RN, Post -op Vital signs reviewed and stable and Patient moving all extremities  Post vital signs: Reviewed and stable  Last Vitals:  Vitals Value Taken Time  BP    Temp    Pulse    Resp    SpO2      Last Pain:  Vitals:   08/23/20 0730  TempSrc:   PainSc: 0-No pain         Complications: No complications documented.

## 2020-08-24 ENCOUNTER — Observation Stay (HOSPITAL_COMMUNITY): Payer: Medicare Other | Admitting: Certified Registered Nurse Anesthetist

## 2020-08-24 ENCOUNTER — Encounter (HOSPITAL_COMMUNITY): Payer: Self-pay | Admitting: General Surgery

## 2020-08-24 ENCOUNTER — Encounter (HOSPITAL_COMMUNITY)
Admission: EM | Disposition: A | Discharge status: Home / Self Care | Payer: Self-pay | Source: Home / Self Care | Attending: Emergency Medicine

## 2020-08-24 ENCOUNTER — Observation Stay (HOSPITAL_COMMUNITY): Payer: Medicare Other

## 2020-08-24 DIAGNOSIS — K8064 Calculus of gallbladder and bile duct with chronic cholecystitis without obstruction: Secondary | ICD-10-CM | POA: Diagnosis not present

## 2020-08-24 DIAGNOSIS — K8001 Calculus of gallbladder with acute cholecystitis with obstruction: Secondary | ICD-10-CM | POA: Diagnosis not present

## 2020-08-24 DIAGNOSIS — R748 Abnormal levels of other serum enzymes: Secondary | ICD-10-CM | POA: Diagnosis not present

## 2020-08-24 DIAGNOSIS — E785 Hyperlipidemia, unspecified: Secondary | ICD-10-CM | POA: Diagnosis not present

## 2020-08-24 DIAGNOSIS — K838 Other specified diseases of biliary tract: Secondary | ICD-10-CM | POA: Diagnosis not present

## 2020-08-24 DIAGNOSIS — I10 Essential (primary) hypertension: Secondary | ICD-10-CM | POA: Diagnosis not present

## 2020-08-24 DIAGNOSIS — Z20822 Contact with and (suspected) exposure to covid-19: Secondary | ICD-10-CM | POA: Diagnosis not present

## 2020-08-24 HISTORY — PX: SPHINCTEROTOMY: SHX5544

## 2020-08-24 HISTORY — PX: REMOVAL OF STONES: SHX5545

## 2020-08-24 HISTORY — PX: ERCP: SHX5425

## 2020-08-24 LAB — CBC
HCT: 46.3 % (ref 39.0–52.0)
Hemoglobin: 14 g/dL (ref 13.0–17.0)
MCH: 26.7 pg (ref 26.0–34.0)
MCHC: 30.2 g/dL (ref 30.0–36.0)
MCV: 88.4 fL (ref 80.0–100.0)
Platelets: 188 10*3/uL (ref 150–400)
RBC: 5.24 MIL/uL (ref 4.22–5.81)
RDW: 15.9 % — ABNORMAL HIGH (ref 11.5–15.5)
WBC: 8.6 10*3/uL (ref 4.0–10.5)
nRBC: 0 % (ref 0.0–0.2)

## 2020-08-24 LAB — GLUCOSE, CAPILLARY
Glucose-Capillary: 108 mg/dL — ABNORMAL HIGH (ref 70–99)
Glucose-Capillary: 123 mg/dL — ABNORMAL HIGH (ref 70–99)
Glucose-Capillary: 221 mg/dL — ABNORMAL HIGH (ref 70–99)
Glucose-Capillary: 184 mg/dL — ABNORMAL HIGH (ref 70–99)
Glucose-Capillary: 183 mg/dL — ABNORMAL HIGH (ref 70–99)
Glucose-Capillary: 149 mg/dL — ABNORMAL HIGH (ref 70–99)

## 2020-08-24 LAB — COMPREHENSIVE METABOLIC PANEL
ALT: 279 U/L — ABNORMAL HIGH (ref 0–44)
AST: 72 U/L — ABNORMAL HIGH (ref 15–41)
Albumin: 3.7 g/dL (ref 3.5–5.0)
Alkaline Phosphatase: 213 U/L — ABNORMAL HIGH (ref 38–126)
Anion gap: 13 (ref 5–15)
BUN: 11 mg/dL (ref 8–23)
CO2: 21 mmol/L — ABNORMAL LOW (ref 22–32)
Calcium: 9 mg/dL (ref 8.9–10.3)
Chloride: 102 mmol/L (ref 98–111)
Creatinine, Ser: 1.16 mg/dL (ref 0.61–1.24)
GFR, Estimated: >60 mL/min (ref >60)
Glucose, Bld: 135 mg/dL — ABNORMAL HIGH (ref 70–99)
Potassium: 3.6 mmol/L (ref 3.5–5.1)
Sodium: 136 mmol/L (ref 135–145)
Total Bilirubin: 1.4 mg/dL — ABNORMAL HIGH (ref 0.3–1.2)
Total Protein: 7.4 g/dL (ref 6.5–8.1)

## 2020-08-24 SURGERY — ERCP, WITH INTERVENTION IF INDICATED
Anesthesia: General

## 2020-08-24 MED ORDER — SUGAMMADEX SODIUM 200 MG/2ML IV SOLN
INTRAVENOUS | Status: DC | PRN
  Administered 2020-08-24: 300 mg via INTRAVENOUS

## 2020-08-24 MED ORDER — ONDANSETRON HCL 4 MG/2ML IJ SOLN
INTRAMUSCULAR | Status: DC | PRN
  Administered 2020-08-24: 4 mg via INTRAVENOUS

## 2020-08-24 MED ORDER — GLUCAGON HCL RDNA (DIAGNOSTIC) 1 MG IJ SOLR
INTRAMUSCULAR | Status: AC
  Filled 2020-08-24: qty 1

## 2020-08-24 MED ORDER — MORPHINE SULFATE (PF) 2 MG/ML IV SOLN: 1.0000 mg | INTRAVENOUS | Status: DC | PRN

## 2020-08-24 MED ORDER — FENTANYL CITRATE (PF) 100 MCG/2ML IJ SOLN
INTRAMUSCULAR | Status: AC
  Filled 2020-08-24: qty 2

## 2020-08-24 MED ORDER — PHENYLEPHRINE 40 MCG/ML (10ML) SYRINGE FOR IV PUSH (FOR BLOOD PRESSURE SUPPORT)
PREFILLED_SYRINGE | INTRAVENOUS | Status: DC | PRN
  Administered 2020-08-24: 80 ug via INTRAVENOUS
  Administered 2020-08-24: 120 ug via INTRAVENOUS
  Administered 2020-08-24: 80 ug via INTRAVENOUS
  Administered 2020-08-24 (×3): 120 ug via INTRAVENOUS

## 2020-08-24 MED ORDER — GLYCOPYRROLATE PF 0.2 MG/ML IJ SOSY
PREFILLED_SYRINGE | INTRAMUSCULAR | Status: DC | PRN
  Administered 2020-08-24: .2 mg via INTRAVENOUS

## 2020-08-24 MED ORDER — SODIUM CHLORIDE 0.9 % IV SOLN: INTRAVENOUS | Status: DC

## 2020-08-24 MED ORDER — INDOMETHACIN 50 MG RE SUPP
RECTAL | Status: AC
  Filled 2020-08-24: qty 2

## 2020-08-24 MED ORDER — PROPOFOL 500 MG/50ML IV EMUL
INTRAVENOUS | Status: AC
  Filled 2020-08-24: qty 50

## 2020-08-24 MED ORDER — FENTANYL CITRATE (PF) 250 MCG/5ML IJ SOLN
INTRAMUSCULAR | Status: DC | PRN
  Administered 2020-08-24 (×2): 50 ug via INTRAVENOUS

## 2020-08-24 MED ORDER — GLUCAGON HCL RDNA (DIAGNOSTIC) 1 MG IJ SOLR
INTRAMUSCULAR | Status: DC | PRN
  Administered 2020-08-24: .5 mg via INTRAVENOUS

## 2020-08-24 MED ORDER — ROCURONIUM BROMIDE 10 MG/ML (PF) SYRINGE
PREFILLED_SYRINGE | INTRAVENOUS | Status: DC | PRN
  Administered 2020-08-24: 70 mg via INTRAVENOUS

## 2020-08-24 MED ORDER — LIDOCAINE 2% (20 MG/ML) 5 ML SYRINGE
INTRAMUSCULAR | Status: DC | PRN
  Administered 2020-08-24: 100 mg via INTRAVENOUS

## 2020-08-24 MED ORDER — PROPOFOL 10 MG/ML IV BOLUS
INTRAVENOUS | Status: DC | PRN
  Administered 2020-08-24: 200 mg via INTRAVENOUS

## 2020-08-24 MED ORDER — DEXAMETHASONE SODIUM PHOSPHATE 10 MG/ML IJ SOLN
INTRAMUSCULAR | Status: DC | PRN
  Administered 2020-08-24: 4 mg via INTRAVENOUS

## 2020-08-24 MED ORDER — SODIUM CHLORIDE 0.9 % IV SOLN
INTRAVENOUS | Status: DC | PRN
  Administered 2020-08-24: 15 mL

## 2020-08-24 NOTE — Progress Notes (Signed)
PROGRESS NOTE    Patient: Lawrence Macdonald                            PCP: Patient, No Pcp Per                    DOB: Nov 02, 1955            DOA: 08/21/2020 [id number]             DOS: 08/24/2020, 11:36 AM   LOS: 0 days   Date of Service: The patient was seen and examined on 08/24/2020  Subjective:   Day #1 Status post laparoscopic cholecystectomy -with cholangiogram Tolerated procedure well Remain n.p.o. overnight Hemodynamically stable  ERCP earlier this morning -tolerated the procedure Remained stable  No complaints at this time  Brief Narrative:   Hodge Stachnik is a 65 y.o. male with medical history significant of hypertension, hyperlipidemia, non-insulin-dependent type 2 diabetes presenting to the ED with complaints of abdominal pain and nausea. AST 365, ALT 444, alk phos 297, T bili 2.5.  Lipase normal.   UA without signs of infection.   Screening SARS-CoV-2 PCR test  Right upper quadrant ultrasound showing gallstones with mild gallbladder wall thickening but no sonographic Murphy sign.  Also showing dilated CBD of 14 mm but no visualized choledocholithiasis    Assessment & Plan:   Principal Problem:   Cholelithiasis Active Problems:   Elevated liver enzymes   Hypokalemia   HTN (hypertension)   HLD (hyperlipidemia)    Cholelithiasis with concern for possible choledocholithiasis  Elevated liver enzymes Postop day #1 status post laparoscopic cholecystectomy, with cholangiogram Postop day #0 status post ERCP-removal of stones and CBD  -MRCP consistent with cholelithiasis no obvious choledocholithiasis -currently no stones identified in CBD, only in gallbladder about 3 cm -Surgery and GI following  -Status post ERCP this morning, per GI advance to clear liquid diet   Transaminitis  Right upper quadrant ultrasound showing gallstones with mild gallbladder wall thickening and dilated CBD of 14 mm but no visualized choledocholithiasis.   -Monitoring LFTs,  --continue to improve -Liver enzymes are elevated: Worsening -avoiding hepatotoxins  AST 365 >> 400 >>> 135 >> 72.  ALT 444 >> 505 >> 329 >> 279. Alk. phos 297 >> 302 >> 218 >> 213  T bili 2.5. >>  3.3 >> 2.7 >> 1.4  - Lipase normal. -As oral intake improves, will reduce IV fluid hydration -We will continue with empiric antibiotics Cipro and Flagyl    Mild hypokalemia: Potassium 3.4. -Monitoring and repleting -Monitor potassium and magnesium levels, replenish as needed.  Hypertension:  Blood pressure slightly elevated to systolic in the 678-938 range. -Resume home antihypertensives including amlodipine, hydralazine, and irbesartan. -As needed hydralazine -Remained stable at this time  Hyperlipidemia -Holding statins -due to elevated LFTs  Non-insulin-dependent type 2 diabetes -Hold home Metformin.   -  A1c.  7.2 - Sliding scale insulin sensitive every 4 hours... As p.o. intake improves will switch to Q AC at bedtime  DVT prophylaxis: Subcutaneous heparin Code Status: Full code Family Communication: No family available at this time. Disposition Plan: Status is: Observation  The patient remains OBS appropriate and will d/c before 2 midnights.  Dispo: The patient is from: Home  Anticipated d/c is to: Home  Anticipated d/c date is: 2 days  Patient currently is not medically stable to d/c.              Difficult  to place patient No  Level of care: Level of care: Med-Surg      ----------------------------------------------------------------------------------------------------------------------------------- Cultures; None    Antimicrobials: IV Rocephin/Flagyl   Consultants: General surgery/gastroenterologist  Level of care: Med-Surg   Procedures:   No admission procedures for hospital encounter.     Antimicrobials:  Anti-infectives (From admission, onward)   Start     Dose/Rate Route Frequency Ordered Stop    08/22/20 0315  cefTRIAXone (ROCEPHIN) 2 g in sodium chloride 0.9 % 100 mL IVPB        2 g 200 mL/hr over 30 Minutes Intravenous Every 24 hours 08/22/20 0303     08/22/20 0315  metroNIDAZOLE (FLAGYL) IVPB 500 mg        500 mg 100 mL/hr over 60 Minutes Intravenous Every 8 hours 08/22/20 0303         Medication:  . acetaminophen  1,000 mg Oral Q8H  . amLODipine  10 mg Oral Daily  . docusate sodium  100 mg Oral BID  . heparin  5,000 Units Subcutaneous Q8H  . hydrALAZINE  50 mg Oral BID  . influenza vaccine adjuvanted  0.5 mL Intramuscular Tomorrow-1000  . insulin aspart  0-9 Units Subcutaneous Q4H  . irbesartan  300 mg Oral Daily  . mupirocin ointment   Nasal BID    morphine injection, ondansetron (ZOFRAN) IV, oxyCODONE   Objective:   Vitals:   08/24/20 0940 08/24/20 0945 08/24/20 1005 08/24/20 1100  BP: (!) 148/84  (!) 146/79 (!) 145/89  Pulse: 64 68 61 62  Resp: '16 18 16 18  ' Temp:   97.8 F (36.6 C) 98.1 F (36.7 C)  TempSrc:   Oral Oral  SpO2: 91% 93% 91%   Weight:      Height:        Intake/Output Summary (Last 24 hours) at 08/24/2020 1136 Last data filed at 08/24/2020 1114 Gross per 24 hour  Intake 5620.59 ml  Output 1185 ml  Net 4435.59 ml   Filed Weights   08/22/20 0808  Weight: 115.7 kg     Examination:      Physical Exam:   General:  Alert, oriented, cooperative, no distress;   HEENT:  Normocephalic, PERRL, otherwise with in Normal limits   Neuro:  CNII-XII intact. , normal motor and sensation, reflexes intact   Lungs:   Clear to auscultation BL, Respirations unlabored, no wheezes / crackles  Cardio:    S1/S2, RRR, No murmure, No Rubs or Gallops   Abdomen:   Soft, right upper quadrant tenderness, midepigastric tenderness mild  , bowel sounds active all four quadrants,  no guarding or peritoneal signs.  Muscular skeletal:  Limited exam - in bed, able to move all 4 extremities, Normal strength,  2+ pulses,  symmetric, No pitting edema  Skin:   Dry, warm to touch, negative for any Rashes,  Wounds: Please see nursing documentation           LABs:  CBC Latest Ref Rng & Units 08/24/2020 08/23/2020 08/22/2020  WBC 4.0 - 10.5 K/uL 8.6 6.2 7.9  Hemoglobin 13.0 - 17.0 g/dL 14.0 12.5(L) 14.8  Hematocrit 39.0 - 52.0 % 46.3 39.0 46.0  Platelets 150 - 400 K/uL 188 168 199   CMP Latest Ref Rng & Units 08/24/2020 08/23/2020 08/22/2020  Glucose 70 - 99 mg/dL 135(H) 122(H) -  BUN 8 - 23 mg/dL 11 12 -  Creatinine 0.61 - 1.24 mg/dL 1.16 1.06 -  Sodium 135 - 145 mmol/L 136 140 -  Potassium  3.5 - 5.1 mmol/L 3.6 3.7 -  Chloride 98 - 111 mmol/L 102 106 -  CO2 22 - 32 mmol/L 21(L) 24 -  Calcium 8.9 - 10.3 mg/dL 9.0 8.8(L) -  Total Protein 6.5 - 8.1 g/dL 7.4 6.2(L) 7.2  Total Bilirubin 0.3 - 1.2 mg/dL 1.4(H) 2.7(H) 3.3(H)  Alkaline Phos 38 - 126 U/L 213(H) 218(H) 302(H)  AST 15 - 41 U/L 72(H) 135(H) 400(H)  ALT 0 - 44 U/L 279(H) 329(H) 505(H)       Micro Results Recent Results (from the past 240 hour(s))  SARS CORONAVIRUS 2 (TAT 6-24 HRS) Nasopharyngeal Nasopharyngeal Swab     Status: None   Collection Time: 08/22/20  2:04 AM   Specimen: Nasopharyngeal Swab  Result Value Ref Range Status   SARS Coronavirus 2 NEGATIVE NEGATIVE Final    Comment: (NOTE) SARS-CoV-2 target nucleic acids are NOT DETECTED.  The SARS-CoV-2 RNA is generally detectable in upper and lower respiratory specimens during the acute phase of infection. Negative results do not preclude SARS-CoV-2 infection, do not rule out co-infections with other pathogens, and should not be used as the sole basis for treatment or other patient management decisions. Negative results must be combined with clinical observations, patient history, and epidemiological information. The expected result is Negative.  Fact Sheet for Patients: SugarRoll.be  Fact Sheet for Healthcare Providers: https://www.woods-mathews.com/  This test is not yet  approved or cleared by the [place] FDA and  has been authorized for detection and/or diagnosis of SARS-CoV-2 by FDA under an Emergency Use Authorization (EUA). This EUA will remain  in effect (meaning this test can be used) for the duration of the COVID-19 declaration under Se ction 564(b)(1) of the Act, 21 U.S.C. section 360bbb-3(b)(1), unless the authorization is terminated or revoked sooner.  Performed at [facility], [place] [address], [employer] [place] [employer], [place] [address]   Surgical pcr screen     Status: Abnormal   Collection Time: 08/23/20  8:31 AM   Specimen: Nasal Mucosa; Nasal Swab  Result Value Ref Range Status   MRSA, PCR NEGATIVE NEGATIVE Final   Staphylococcus aureus POSITIVE (A) NEGATIVE Final    Comment: (NOTE) The Xpert SA Assay (FDA approved for NASAL specimens in patients 27 years of age and older), is one component of a comprehensive surveillance program. It is not intended to diagnose infection nor to guide or monitor treatment. Performed at [facility], [place] [address] [place], [place] [address]     Radiology Reports DG Cholangiogram Operative  Result Date: 08/23/2020 CLINICAL DATA:  65 year old male with history of cholelithiasis. EXAM: INTRAOPERATIVE CHOLANGIOGRAM TECHNIQUE: Cholangiographic images from the C-arm fluoroscopic device were submitted for interpretation post-operatively. Please see the procedural report for the amount of contrast and the fluoroscopy time utilized. COMPARISON:  MRI abdomen from 08/22/2020 FINDINGS: Intraoperative antegrade injection via the cystic duct which opacifies the common bile duct and central portions of the intrahepatic biliary tree. Contrast is visualized flowing freely into the duodenum. There are no filling defects. Mild superior common bile duct dilation at the level of the confluence. No significant intrahepatic biliary ductal dilation. No apparent anomalous anatomical configuration of the biliary  tree. IMPRESSION: No evidence of choledocholithiasis. Ruthann Cancer, MD [facility] Radiology Electronically Signed   By: Ruthann Cancer MD   On: 08/23/2020 12:43   MR 3D Recon At Scanner  Addendum Date: 08/22/2020   ADDENDUM REPORT: 08/22/2020 10:30 ADDENDUM: The original report was by Dr. Van Clines.  The following addendum is by Dr. Van Clines: I discussed this case with Dr. Lizbeth Bark at 10:20 a.m. on 08/22/2020. Our discussion revolved around the appearance of the distal CBD on image 17 of series 11, where the conical tapering of the duct appears slightly truncated in a fashion that can sometimes indicate a stone. Unfortunately this image is limited by volume averaging, and the diagnostic thin-section MRCP images in this region are highly indistinct/obscured by motion artifact. In my original analysis of this case, I analyzed the axial images as a tie breaker in assessing this region, and the thin AP diameter of the distal CBD at about 2 mm in the lack of a well-defined filling defect as well as the motion artifact lead me to more conservatively indicate that no well-defined filling defect was identified. That said, the presence of small distal CBD stones is difficult to confidently exclude, and if based on complementary clinical indicators there is a high suspicion, procedural intervention for further characterization and potentially treatment might be appropriate. Electronically Signed   By: Van Clines M.D.   On: 08/22/2020 10:30   Result Date: 08/22/2020 CLINICAL DATA:  Right upper quadrant and epigastric pain for greater than 1 month, increasing. Ultrasound showed bile duct dilatation along with cholelithiasis. EXAM: MRI ABDOMEN WITHOUT AND WITH CONTRAST (INCLUDING MRCP) TECHNIQUE: Multiplanar multisequence MR imaging of the abdomen was performed both before and after the administration of intravenous contrast. Heavily T2-weighted  images of the biliary and pancreatic ducts were obtained, and three-dimensional MRCP images were rendered by post processing. CONTRAST:  44m GADAVIST GADOBUTROL 1 MMOL/ML IV SOLN COMPARISON:  08/22/2020 ultrasound FINDINGS: Despite efforts by the technologist and patient, motion artifact is present on today's exam and could not be eliminated. This reduces exam sensitivity and specificity. Lower chest: Unremarkable suspected cardiomegaly. Otherwise unremarkable. Hepatobiliary: Multiple gallstones in the gallbladder measuring up to 2.6 cm in diameter. Gallbladder wall thickening is present. The common hepatic duct measures up to 0.8 cm for example on image 49 of series 6, with the confluence of the cystic duct and common hepatic duct well seen on image 22 of series 15. The common bile duct measures up to 0.6 cm in diameter for example on images 22-23 of series 15. I not see a well-defined filling defect in the common bile duct, although the dedicated MRCP images are blurred by motion artifact. There is a suggestion of mild periportal edema. Clustered likely reactive lymph nodes in the porta hepatis and peripancreatic region including a 1.0 cm portacaval lymph node. Pancreas:  Unremarkable Spleen:  Unremarkable Adrenals/Urinary Tract: The adrenal glands appear unremarkable. 1.1 cm fluid signal intensity lesion of the right kidney lower pole laterally compatible with cyst. Stomach/Bowel: Scattered diverticula of the descending colon. Vascular/Lymphatic:  Reactive lymph nodes in the porta hepatis. Other:  Trace perihepatic ascites. Musculoskeletal: There is evidence of lumbar spondylosis and degenerative disc disease, with suspicion for central narrowing of the thecal sac at L1-2 and L2-3. IMPRESSION: 1. Cholelithiasis with gallbladder wall thickening and pericholecystic edema. Correlate clinically in assessing for acute cholecystitis. 2. Mild dilatation of the common hepatic duct, with the common bile duct currently  within normal limits at 0.6 cm in diameter. No definite filling defect in the CBD, although motion artifact reduces sensitivity. 3. Clustered likely reactive lymph nodes in the porta hepatis and peripancreatic region including a 1.0 cm portacaval lymph node. 4. Despite efforts by the technologist and patient, motion artifact is present on today's exam and could not be  eliminated. This reduces exam sensitivity and specificity. 5. Scattered diverticula of the descending colon. 6. Trace perihepatic ascites. 7. Lumbar spondylosis and degenerative disc disease, with suspicion for central narrowing of the thecal sac at L1-2 and L2-3. Electronically Signed: By: Van Clines M.D. On: 08/22/2020 07:49   US Abdomen Limited  Result Date: 08/22/2020 CLINICAL DATA:  Right upper quadrant and epigastric pain. EXAM: ULTRASOUND ABDOMEN LIMITED RIGHT UPPER QUADRANT COMPARISON:  None. FINDINGS: Gallbladder: Physiologic lead distended. Gallstones including a large 2.9 cm stone. There is mild wall thickening at 5 mm. No pericholecystic fluid. No sonographic Murphy sign noted by sonographer. Common bile duct: Diameter: 13 mm proximally, 14 mm distally. No visualized choledocholithiasis. Liver: No focal lesion identified. Within normal limits in parenchymal echogenicity. Portal vein is patent on color Doppler imaging with normal direction of blood flow towards the liver. Other: No upper quadrant ascites. Incidental note of prominent column of Bertin in the right kidney. IMPRESSION: 1. Gallstones with mild gallbladder wall thickening. No sonographic Murphy sign. Findings may represent acute cholecystitis in the appropriate clinical setting. 2. Dilated common bile duct at 14 mm. No visualized choledocholithiasis. MRCP could be considered for biliary tree evaluation if patient is able to tolerate breath hold technique. Electronically Signed   By: Keith Rake M.D.   On: 08/22/2020 00:42   DG ERCP BILIARY & PANCREATIC  DUCTS  Result Date: 08/24/2020 CLINICAL DATA:  65 year old male with cholelithiasis/choledocholithiasis EXAM: ERCP TECHNIQUE: Multiple spot images obtained with the fluoroscopic device and submitted for interpretation post-procedure. FLUOROSCOPY TIME:  Fluoroscopy Time:  7 minutes 34 seconds COMPARISON:  None. FINDINGS: Limited intraoperative fluoroscopic spot images during ERCP. Initial image demonstrates endoscope projecting over the upper abdomen with partial opacification of the extrahepatic biliary ducts. Subsequently there is deployment of a retrieval balloon. IMPRESSION: Limited images during ERCP demonstrates deployment of a retrieval balloon within the common bile duct. Please refer to the dictated operative report for full details of intraoperative findings and procedure. Electronically Signed   By: Corrie Mckusick D.O.   On: 08/24/2020 09:43   MR ABDOMEN MRCP W WO CONTAST  Addendum Date: 08/22/2020   ADDENDUM REPORT: 08/22/2020 10:30 ADDENDUM: The original report was by Dr. Van Clines. The following addendum is by Dr. Van Clines: I discussed this case with Dr. Lizbeth Bark at 10:20 a.m. on 08/22/2020. Our discussion revolved around the appearance of the distal CBD on image 17 of series 11, where the conical tapering of the duct appears slightly truncated in a fashion that can sometimes indicate a stone. Unfortunately this image is limited by volume averaging, and the diagnostic thin-section MRCP images in this region are highly indistinct/obscured by motion artifact. In my original analysis of this case, I analyzed the axial images as a tie breaker in assessing this region, and the thin AP diameter of the distal CBD at about 2 mm in the lack of a well-defined filling defect as well as the motion artifact lead me to more conservatively indicate that no well-defined filling defect was identified. That said, the presence of small distal CBD stones is difficult to confidently exclude, and if  based on complementary clinical indicators there is a high suspicion, procedural intervention for further characterization and potentially treatment might be appropriate. Electronically Signed   By: Van Clines M.D.   On: 08/22/2020 10:30   Result Date: 08/22/2020 CLINICAL DATA:  Right upper quadrant and epigastric pain for greater than 1 month, increasing. Ultrasound showed bile duct dilatation along with cholelithiasis.  EXAM: MRI ABDOMEN WITHOUT AND WITH CONTRAST (INCLUDING MRCP) TECHNIQUE: Multiplanar multisequence MR imaging of the abdomen was performed both before and after the administration of intravenous contrast. Heavily T2-weighted images of the biliary and pancreatic ducts were obtained, and three-dimensional MRCP images were rendered by post processing. CONTRAST:  33m GADAVIST GADOBUTROL 1 MMOL/ML IV SOLN COMPARISON:  08/22/2020 ultrasound FINDINGS: Despite efforts by the technologist and patient, motion artifact is present on today's exam and could not be eliminated. This reduces exam sensitivity and specificity. Lower chest: Unremarkable suspected cardiomegaly. Otherwise unremarkable. Hepatobiliary: Multiple gallstones in the gallbladder measuring up to 2.6 cm in diameter. Gallbladder wall thickening is present. The common hepatic duct measures up to 0.8 cm for example on image 49 of series 6, with the confluence of the cystic duct and common hepatic duct well seen on image 22 of series 15. The common bile duct measures up to 0.6 cm in diameter for example on images 22-23 of series 15. I not see a well-defined filling defect in the common bile duct, although the dedicated MRCP images are blurred by motion artifact. There is a suggestion of mild periportal edema. Clustered likely reactive lymph nodes in the porta hepatis and peripancreatic region including a 1.0 cm portacaval lymph node. Pancreas:  Unremarkable Spleen:  Unremarkable Adrenals/Urinary Tract: The adrenal glands appear  unremarkable. 1.1 cm fluid signal intensity lesion of the right kidney lower pole laterally compatible with cyst. Stomach/Bowel: Scattered diverticula of the descending colon. Vascular/Lymphatic:  Reactive lymph nodes in the porta hepatis. Other:  Trace perihepatic ascites. Musculoskeletal: There is evidence of lumbar spondylosis and degenerative disc disease, with suspicion for central narrowing of the thecal sac at L1-2 and L2-3. IMPRESSION: 1. Cholelithiasis with gallbladder wall thickening and pericholecystic edema. Correlate clinically in assessing for acute cholecystitis. 2. Mild dilatation of the common hepatic duct, with the common bile duct currently within normal limits at 0.6 cm in diameter. No definite filling defect in the CBD, although motion artifact reduces sensitivity. 3. Clustered likely reactive lymph nodes in the porta hepatis and peripancreatic region including a 1.0 cm portacaval lymph node. 4. Despite efforts by the technologist and patient, motion artifact is present on today's exam and could not be eliminated. This reduces exam sensitivity and specificity. 5. Scattered diverticula of the descending colon. 6. Trace perihepatic ascites. 7. Lumbar spondylosis and degenerative disc disease, with suspicion for central narrowing of the thecal sac at L1-2 and L2-3. Electronically Signed: By: WVan ClinesM.D. On: 08/22/2020 07:49    SIGNED: SDeatra James MD, FHM. [facility],  Pager (please use [email or web address] to page/text) Please use Epic Secure Chat for non-urgent communication (7AM-7PM)  If 7PM-7AM, please contact night-coverage [email or web address], 08/24/2020, 11:36 AM

## 2020-08-24 NOTE — Anesthesia Procedure Notes (Signed)
Procedure Name: Intubation Date/Time: 08/24/2020 8:03 AM Performed by: Mitzie Na, CRNA Pre-anesthesia Checklist: Patient identified, Emergency Drugs available, Suction available and Patient being monitored Patient Re-evaluated:Patient Re-evaluated prior to induction Oxygen Delivery Method: Circle system utilized Preoxygenation: Pre-oxygenation with 100% oxygen Induction Type: IV induction Ventilation: Mask ventilation without difficulty Laryngoscope Size: Mac and 4 Grade View: Grade II Tube type: Oral Tube size: 7.5 mm Number of attempts: 1 Airway Equipment and Method: Stylet and Oral airway Placement Confirmation: ETT inserted through vocal cords under direct vision,  positive ETCO2 and breath sounds checked- equal and bilateral Secured at: 25 cm Tube secured with: Tape Dental Injury: Teeth and Oropharynx as per pre-operative assessment

## 2020-08-24 NOTE — Op Note (Signed)
[facility] Patient Name: Lawrence Macdonald Procedure Date: 08/24/2020 MRN: [id number] Attending MD: Vida Rigger , MD Date of Birth: [birth year] CSN: [id number] Age: 65 Admit Type: Inpatient Procedure:                ERCP Indications:              Evaluation and possible treatment of bile duct                            stone(s) Providers:                Vida Rigger, MD, Vicki Mallet, RN, Sunday Corn Mbumina,                            Technician Referring MD:              Medicines:                General Anesthesia Complications:            No immediate complications. Estimated Blood Loss:     Estimated blood loss: none. Procedure:                Pre-Anesthesia Assessment:                           - Prior to the procedure, a History and Physical                            was performed, and patient medications and                            allergies were reviewed. The patient's tolerance of                            previous anesthesia was also reviewed. The risks                            and benefits of the procedure and the sedation                            options and risks were discussed with the patient.                            All questions were answered, and informed consent                            was obtained. Prior Anticoagulants: The patient has                            taken no previous anticoagulant or antiplatelet                            agents. ASA Grade Assessment: II - A patient with                            mild systemic disease. After reviewing  the risks                            and benefits, the patient was deemed in                            satisfactory condition to undergo the procedure.                           After obtaining informed consent, the scope was                            passed under direct vision. Throughout the                            procedure, the patient's blood pressure, pulse, and                             oxygen saturations were monitored continuously. The                            Olympus [id number]-[id number]) was introduced through                            the mouth, and used to inject contrast into and                            used to locate the major papilla. The ERCP was                            somewhat difficult due to challenging cannulation.                            Successful completion of the procedure was aided by                            performing the maneuvers documented (below) in this                            report. The patient tolerated the procedure well. Scope In: Scope Out: Findings:      The major papilla was bulging. Initially the wire went towards the       pancreas a few times and we elected to place one 4 Fr by 3 cm pancreatic       stent with a 3/4 external pigtail and no internal flaps was placed 2 cm       into the ventral pancreatic duct. The stent was in good position. We       initially tried to cannulate with the regular sphincterotome and 0.035       wire but the wire seem to go towards the pancreas a few times so we       switched to the smaller sphincterotome and smaller wire and deep       selective cannulation was readily obtained despite the PD stent       accidentally  being removed when the regular sphincterotome was removed       and the PD stent was recovered and once deep selective cannulation was       obtained and confirmed on cholangiogram biliary sphincterotomy was made       with a Hydratome sphincterotome using ERBE electrocautery. There was no       post-sphincterotomy bleeding. We proceeded till we had adequate biliary       drainage and could get the fully bowed sphincterotome easily in and out       of the duct choledocholithiasis was found in a mildly dilated duct. The       biliary tree was swept with an adjustable 12- 15 mm balloon starting at       the bifurcation. All stones were removed. Both sides balloons were swept        through the duct and the 12 passed fairly readily and the 15 with some       mild resistance nothing was found at the end of the procedure on       occlusion cholangiogram. No obvious residual stone was seen at the end       of the procedure and there was some biliary drainage although slightly       sluggish due to ampullary edema and the scope and the balloon and the       wire were removed and the patient tolerated the procedure well Impression:               - The major papilla appeared to be bulging.                           - Choledocholithiasis was found. Complete removal                            was accomplished by biliary sphincterotomy and                            balloon extraction.                           - One pancreatic stent was placed into the ventral                            pancreatic duct. It was accidentally removed with                            withdrawing of the sphincterotome as above                           - A biliary sphincterotomy was performed.                           - The biliary tree was swept and nothing was found                            on occlusion cholangiogram at the end of the  procedure. Moderate Sedation:      Not Applicable - Patient had care per Anesthesia. Recommendation:           - Clear liquid diet for 6 hours. If doing well may                            have soft solids later today                           - Continue present medications.                           - Return to GI clinic PRN.                           - Telephone GI clinic if symptomatic PRN.                           - Check liver enzymes (AST, ALT, alkaline                            phosphatase, bilirubin) tomorrow and follow back to                            normal as an outpatient. Procedure Code(s):        --- Professional ---                           [phone], Esophagogastroduodenoscopy, flexible,                             transoral; diagnostic, including collection of                            specimen(s) by brushing or washing, when performed                            (separate procedure) Diagnosis Code(s):        --- Professional ---                           K80.50, Calculus of bile duct without cholangitis                            or cholecystitis without obstruction                           K83.8, Other specified diseases of biliary tract CPT copyright 2019 [employer]. All rights reserved. The codes documented in this report are preliminary and upon coder review may  be revised to meet current compliance requirements. Vida RiggerMarc Maxie Debose, MD 08/24/2020 9:16:04 AM This report has been signed electronically. Number of Addenda: 0

## 2020-08-24 NOTE — Anesthesia Postprocedure Evaluation (Signed)
Anesthesia Post Note  Patient: Lawrence Macdonald  Procedure(s) Performed: ENDOSCOPIC RETROGRADE CHOLANGIOPANCREATOGRAPHY (ERCP) (N/A ) SPHINCTEROTOMY REMOVAL OF STONES     Patient location during evaluation: PACU Anesthesia Type: General Level of consciousness: awake and alert Pain management: pain level controlled Vital Signs Assessment: post-procedure vital signs reviewed and stable Respiratory status: spontaneous breathing, nonlabored ventilation, respiratory function stable and patient connected to nasal cannula oxygen Cardiovascular status: blood pressure returned to baseline and stable Postop Assessment: no apparent nausea or vomiting Anesthetic complications: no   No complications documented.  Last Vitals:  Vitals:   08/24/20 1005 08/24/20 1100  BP: (!) 146/79 (!) 145/89  Pulse: 61 62  Resp: 16 18  Temp: 36.6 C 36.7 C  SpO2: 91%     Last Pain:  Vitals:   08/24/20 1100  TempSrc: Oral  PainSc:                  Analee Montee S

## 2020-08-24 NOTE — Anesthesia Preprocedure Evaluation (Signed)
Anesthesia Evaluation  Patient identified by MRN, date of birth, ID band Patient awake    Reviewed: Allergy & Precautions, H&P , NPO status , Patient's Chart, lab work & pertinent test results  Airway Mallampati: II  TM Distance: >3 FB Neck ROM: Full    Dental no notable dental hx.    Pulmonary neg pulmonary ROS,    Pulmonary exam normal breath sounds clear to auscultation       Cardiovascular hypertension, Normal cardiovascular exam Rhythm:Regular Rate:Normal     Neuro/Psych negative neurological ROS  negative psych ROS   GI/Hepatic negative GI ROS, Neg liver ROS,   Endo/Other  diabetes, Insulin Dependent  Renal/GU negative Renal ROS  negative genitourinary   Musculoskeletal negative musculoskeletal ROS (+)   Abdominal   Peds negative pediatric ROS (+)  Hematology negative hematology ROS (+)   Anesthesia Other Findings   Reproductive/Obstetrics negative OB ROS                             Anesthesia Physical Anesthesia Plan  ASA: III and emergent  Anesthesia Plan: General   Post-op Pain Management:    Induction: Intravenous  PONV Risk Score and Plan: 2 and Ondansetron, Dexamethasone and Treatment may vary due to age or medical condition  Airway Management Planned: Oral ETT  Additional Equipment:   Intra-op Plan:   Post-operative Plan: Extubation in OR  Informed Consent: I have reviewed the patients History and Physical, chart, labs and discussed the procedure including the risks, benefits and alternatives for the proposed anesthesia with the patient or authorized representative who has indicated his/her understanding and acceptance.     Dental advisory given  Plan Discussed with: CRNA and Surgeon  Anesthesia Plan Comments:         Anesthesia Quick Evaluation

## 2020-08-24 NOTE — Progress Notes (Signed)
Lawrence Macdonald 7:41 AM  Subjective: Patient with a different and less pain that brought him in here and he believes he is having a little dizziness from the morphine but no other new complaints and his case discussed yesterday with the surgeons  Objective: Vital signs stable afebrile no acute distress exam please see preassessment evaluation mild upper tenderness only LFTs decreased CBC okay Intra-Op cholangiogram reviewed agree with surgeons probable residual small CBD stone on Intra-Op cholangiogram  Assessment: Probable CBD stones  Plan: We rediscussed ERCP with the patient and will proceed this morning with anesthesia assistance  [facility]  office [phone] After 5PM or if no answer call [phone]

## 2020-08-24 NOTE — Transfer of Care (Signed)
Immediate Anesthesia Transfer of Care Note  Patient: Lawrence Macdonald  Procedure(s) Performed: ENDOSCOPIC RETROGRADE CHOLANGIOPANCREATOGRAPHY (ERCP) (N/A ) SPHINCTEROTOMY REMOVAL OF STONES  Patient Location: PACU  Anesthesia Type:General  Level of Consciousness: awake, alert , oriented and patient cooperative  Airway & Oxygen Therapy: Patient Spontanous Breathing and Patient connected to face mask oxygen  Post-op Assessment: Report given to RN, Post -op Vital signs reviewed and stable and Patient moving all extremities  Post vital signs: Reviewed and stable  Last Vitals:  Vitals Value Taken Time  BP    Temp    Pulse 86 08/24/20 0919  Resp 11 08/24/20 0919  SpO2 99 % 08/24/20 0919  Vitals shown include unvalidated device data.  Last Pain:  Vitals:   08/24/20 0730  TempSrc: Oral  PainSc: 4       Patients Stated Pain Goal: 2 (08/24/20 0132)  Complications: No complications documented.

## 2020-08-25 DIAGNOSIS — E785 Hyperlipidemia, unspecified: Secondary | ICD-10-CM | POA: Diagnosis not present

## 2020-08-25 DIAGNOSIS — R748 Abnormal levels of other serum enzymes: Secondary | ICD-10-CM | POA: Diagnosis not present

## 2020-08-25 DIAGNOSIS — K8064 Calculus of gallbladder and bile duct with chronic cholecystitis without obstruction: Secondary | ICD-10-CM | POA: Diagnosis not present

## 2020-08-25 DIAGNOSIS — I10 Essential (primary) hypertension: Secondary | ICD-10-CM | POA: Diagnosis not present

## 2020-08-25 DIAGNOSIS — K8001 Calculus of gallbladder with acute cholecystitis with obstruction: Secondary | ICD-10-CM | POA: Diagnosis not present

## 2020-08-25 LAB — COMPREHENSIVE METABOLIC PANEL
ALT: 183 U/L — ABNORMAL HIGH (ref 0–44)
AST: 41 U/L (ref 15–41)
Albumin: 3.4 g/dL — ABNORMAL LOW (ref 3.5–5.0)
Alkaline Phosphatase: 163 U/L — ABNORMAL HIGH (ref 38–126)
Anion gap: 13 (ref 5–15)
BUN: 10 mg/dL (ref 8–23)
CO2: 21 mmol/L — ABNORMAL LOW (ref 22–32)
Calcium: 8.9 mg/dL (ref 8.9–10.3)
Chloride: 105 mmol/L (ref 98–111)
Creatinine, Ser: 1.14 mg/dL (ref 0.61–1.24)
GFR, Estimated: >60 mL/min (ref >60)
Glucose, Bld: 127 mg/dL — ABNORMAL HIGH (ref 70–99)
Potassium: 3.4 mmol/L — ABNORMAL LOW (ref 3.5–5.1)
Sodium: 139 mmol/L (ref 135–145)
Total Bilirubin: 1 mg/dL (ref 0.3–1.2)
Total Protein: 6.8 g/dL (ref 6.5–8.1)

## 2020-08-25 LAB — CBC
HCT: 38.5 % — ABNORMAL LOW (ref 39.0–52.0)
Hemoglobin: 12.4 g/dL — ABNORMAL LOW (ref 13.0–17.0)
MCH: 27 pg (ref 26.0–34.0)
MCHC: 32.2 g/dL (ref 30.0–36.0)
MCV: 83.7 fL (ref 80.0–100.0)
Platelets: 173 10*3/uL (ref 150–400)
RBC: 4.6 MIL/uL (ref 4.22–5.81)
RDW: 15.9 % — ABNORMAL HIGH (ref 11.5–15.5)
WBC: 7.1 10*3/uL (ref 4.0–10.5)
nRBC: 0 % (ref 0.0–0.2)

## 2020-08-25 LAB — GLUCOSE, CAPILLARY
Glucose-Capillary: 108 mg/dL — ABNORMAL HIGH (ref 70–99)
Glucose-Capillary: 116 mg/dL — ABNORMAL HIGH (ref 70–99)
Glucose-Capillary: 126 mg/dL — ABNORMAL HIGH (ref 70–99)
Glucose-Capillary: 131 mg/dL — ABNORMAL HIGH (ref 70–99)

## 2020-08-25 MED ORDER — ACETAMINOPHEN 500 MG PO TABS: ORAL_TABLET | ORAL | 0 refills | Status: AC

## 2020-08-25 MED ORDER — DOCUSATE SODIUM 100 MG PO CAPS: 100.0000 mg | ORAL_CAPSULE | Freq: Two times a day (BID) | ORAL | 0 refills | Status: AC

## 2020-08-25 MED ORDER — NAPROXEN SODIUM 220 MG PO TABS: ORAL_TABLET | ORAL | Status: DC

## 2020-08-25 MED ORDER — OXYCODONE HCL 5 MG PO TABS: ORAL_TABLET | ORAL | 0 refills | Status: DC

## 2020-08-25 NOTE — Progress Notes (Signed)
[facility] Gastroenterology Progress Note  Lawrence Macdonald 65 y.o. 03-19-1956  CC:  Choledocholithiasis s/p ERCP 08/24/20  Subjective: Patient reports mild soreness but no overt abdominal pain.  Denies nausea/vomiting.  Is tolerating a diet.  He is passing flatus but has not yet had a BM.  ROS : Review of Systems  Cardiovascular: Negative for chest pain and palpitations.  Gastrointestinal: Negative for abdominal pain, blood in stool, constipation, diarrhea, heartburn, melena, nausea and vomiting.   Objective: Vital signs in last 24 hours: Vitals:   08/25/20 0459 08/25/20 0833  BP: (!) 149/92 (!) 154/87  Pulse: 63 64  Resp: 15 18  Temp: 98.4 F (36.9 C) 98.8 F (37.1 C)  SpO2: 96% 97%    Physical Exam:  General:  Alert, oriented, cooperative, no distress  Head:  Normocephalic, without obvious abnormality, atraumatic  Eyes:  Anicteric sclera, EOMs intact   Lungs:   Clear to auscultation bilaterally, respirations unlabored  Heart:  Regular rate and rhythm, S1, S2 normal  Abdomen:   Soft, mild incisional site tenderness, non-distended, bowel sounds active all four quadrants  Extremities: Extremities normal, atraumatic, no  edema  Pulses: 2+ and symmetric    Lab Results: Recent Labs    08/24/20 0526 08/25/20 0446  NA 136 139  K 3.6 3.4*  CL 102 105  CO2 21* 21*  GLUCOSE 135* 127*  BUN 11 10  CREATININE 1.16 1.14  CALCIUM 9.0 8.9   Recent Labs    08/24/20 0526 08/25/20 0446  AST 72* 41  ALT 279* 183*  ALKPHOS 213* 163*  BILITOT 1.4* 1.0  PROT 7.4 6.8  ALBUMIN 3.7 3.4*   Recent Labs    08/24/20 0526 08/25/20 0446  WBC 8.6 7.1  HGB 14.0 12.4*  HCT 46.3 38.5*  MCV 88.4 83.7  PLT 188 173   No results for input(s): LABPROT, INR in the last 72 hours.   Assessment: Choledocholithiasis s/p ERCP 08/24/20 with stone extraction and sphincterotomy; s/p lap chole 08/23/20 -T. Bili now normal, 1.0.  LFTs improving: AST 41/ ALT 183/ ALP 163 -No leukocytosis (WBCs 7.1)    Plan: OK to discharge from a GI standpoint.  Follow LFTs to normalization as an outpatient.    [facility] will sign off.  Please contact us if we can be of any further assistance during this hospital stay.  Edrick Kins PA-C 08/25/2020, 9:40 AM  Contact #  [phone]

## 2020-08-25 NOTE — Discharge Instructions (Signed)
[facility] ______[facility] °LAPAROSCOPIC SURGERY: POST OP INSTRUCTIONS °Always review your discharge instruction sheet given to you by the facility where your surgery was performed. °IF YOU HAVE DISABILITY OR FAMILY LEAVE FORMS, YOU MUST BRING THEM TO THE OFFICE FOR PROCESSING.   °DO NOT GIVE THEM TO YOUR DOCTOR. ° °1. A prescription for pain medication may be given to you upon discharge.  Take your pain medication as prescribed, if needed.  If narcotic pain medicine is not needed, then you may take acetaminophen (Tylenol) or ibuprofen (Advil) as needed. °2. Take your usually prescribed medications unless otherwise directed. °3. If you need a refill on your pain medication, please contact your pharmacy.  They will contact our office to request authorization. Prescriptions will not be filled after 5pm or on week-ends. °4. You should follow a light diet the first few days after arrival home, such as soup and crackers, etc.  Be sure to include lots of fluids daily. °5. Most patients will experience some swelling and bruising in the area of the incisions.  Ice packs will help.  Swelling and bruising can take several days to resolve.  °6. It is common to experience some constipation if taking pain medication after surgery.  Increasing fluid intake and taking a stool softener (such as Colace) will usually help or prevent this problem from occurring.  A mild laxative (Milk of Magnesia or Miralax) should be taken according to package instructions if there are no bowel movements after 48 hours. °7. Unless discharge instructions indicate otherwise, you may remove your bandages 24-48 hours after surgery, and you may shower at that time.  You may have steri-strips (small skin tapes) in place directly over the incision.  These strips should be left on the skin for 7-10 days.  If your surgeon used skin glue on the incision, you may shower in 24 hours.  The glue will flake off over the next 2-3 weeks.  Any sutures or  staples will be removed at the office during your follow-up visit. °8. ACTIVITIES:  You may resume regular (light) daily activities beginning the next day--such as daily self-care, walking, climbing stairs--gradually increasing activities as tolerated.  You may have sexual intercourse when it is comfortable.  Refrain from any heavy lifting or straining until approved by your doctor. °a. You may drive when you are no longer taking prescription pain medication, you can comfortably wear a seatbelt, and you can safely maneuver your car and apply brakes. °b. RETURN TO WORK:  __________________________________________________________ °9. You should see your doctor in the office for a follow-up appointment approximately 2-3 weeks after your surgery.  Make sure that you call for this appointment within a day or two after you arrive home to insure a convenient appointment time. °10. OTHER INSTRUCTIONS: __________________________________________________________________________________________________________________________ __________________________________________________________________________________________________________________________ °WHEN TO CALL YOUR DOCTOR: °1. Fever over 101.0 °2. Inability to urinate °3. Continued bleeding from incision. °4. Increased pain, redness, or drainage from the incision. °5. Increasing abdominal pain ° °The clinic staff is available to answer your questions during regular business hours.  Please don’t hesitate to call and ask to speak to one of the nurses for clinical concerns.  If you have a medical emergency, go to the nearest emergency room or call 911.  A surgeon from [facility] is always on call at the hospital. °[address], [address], [place], [place]  [address] ? [address] [address], [place], [place]   [address] °[phone] ? [phone] ? FAX [phone] °Web site:   [email or web address] °

## 2020-08-25 NOTE — Discharge Summary (Signed)
Physician Discharge Summary [facility]    Patient: Lawrence Macdonald                   Admit date: 08/21/2020   DOB: 1955/07/31             Discharge date:08/25/2020/8:57 AM JSH:[id number]                          PCP: Patient, No Pcp Per  Disposition: HOME  Recommendations for Outpatient Follow-up:   . Follow up: in 1 week  Discharge Condition: Stable   Code Status:   Code Status: Full Code  Diet recommendation: Diabetic diet   Discharge Diagnoses:    Principal Problem:   Cholelithiasis Active Problems:   Elevated liver enzymes   Hypokalemia   HTN (hypertension)   HLD (hyperlipidemia)   History of Present Illness/ Hospital Course Lawrence Macdonald Summary:    Lawrence Macdonald a 65 y.o.malewith medical history significant ofhypertension, hyperlipidemia, non-insulin-dependent type 2 diabetes presenting to the ED with complaints of abdominal pain and nausea. AST 365, ALT 444, alk phos 297, T bili 2.5. Lipase normal.  UA without signs of infection.  Screening SARS-CoV-2 PCR test  Right upper quadrant ultrasound showing gallstones with mild gallbladder wall thickening but no sonographic Murphy sign. Also showing dilated CBD of 14 mm but no visualized choledocholithiasis  Cholelithiasis with concern for possible choledocholithiasis  Elevated liver enzymes Postop day #2 status post laparoscopic cholecystectomy, with cholangiogram Postop day #1 status post ERCP-removal of stones and CBD  -MRCP consistent with cholelithiasis no obvious choledocholithiasis -currently no stones identified in CBD, only in gallbladder about 3 cm -Surgery and GI was  following  -Tolerated clear liquid diet--advancing   Transaminitis  Right upper quadrant ultrasound showing gallstones with mild gallbladder wall thickeninganddilated CBD of 14 mm but no visualized choledocholithiasis. -Monitoring LFTs, --much improved -Liver enzymes are elevated: Worsening -avoiding hepatotoxins   AST 365 >> 400 >>> 135 >> 72.  ALT 444 >> 505 >> 329 >> 279. Alk. phos 297 >> 302 >> 218 >> 213  T bili 2.5. >>  3.3 >> 2.7 >> 1.4  -Lipase normal. -As oral intake improves, will reduce IV fluid hydration -We will continue with empiric antibiotics Cipro and Flagyl    Mild hypokalemia:Potassium 3.4. -Monitoring and repleting -Monitor potassium and magnesium levels, replenish as needed.  Hypertension:  Blood pressure slightly elevated to systolic in the 850-277 range. -Resume home antihypertensives including amlodipine, hydralazine, and irbesartan. -As needed hydralazine -Remained stable at this time  Hyperlipidemia -Holding statins -due to elevated LFTs  Non-insulin-dependent type 2 diabetes -Hold home Metformin.  -  A1c.  7.2 -Sliding scale insulin sensitive every 4 hours... As p.o. intake improves will switch to Q AC at bedtime   Code Status:Full code Family Communication:Wife   The patient remains OBS appropriate and will d/c before 2 midnights.  Dispo: The patient is from:Home Anticipated d/c is AJ:OINO     Discharge Instructions:   Discharge Instructions    Activity as tolerated - No restrictions   Complete by: As directed    Diet - low sodium heart healthy   Complete by: As directed    Discharge instructions   Complete by: As directed    Increase activity slowly   Complete by: As directed        Medication List    TAKE these medications   acetaminophen 500 MG tablet Commonly known as: TYLENOL You can take 1000  mg of Tylenol/acetaminophen every 6 hours as needed for pain.  Do not exceed 4000 mg of Tylenol per day it can harm your liver.  You can buy this over-the-counter at any drugstore.  You can use naproxen in addition to plain Tylenol for pain.   albuterol 108 (90 Base) MCG/ACT inhaler Commonly known as: VENTOLIN HFA   amLODipine 10 MG tablet Commonly known as: NORVASC Take 10 mg by mouth daily.   atorvastatin  40 MG tablet Commonly known as: LIPITOR Take 40 mg by mouth daily.   docusate sodium 100 MG capsule Commonly known as: COLACE Take 1 capsule (100 mg total) by mouth 2 (two) times daily.   hydrALAZINE 50 MG tablet Commonly known as: APRESOLINE Take 50 mg by mouth 2 (two) times daily.   metFORMIN 500 MG tablet Commonly known as: GLUCOPHAGE Take 500 mg by mouth daily.   naproxen sodium 220 MG tablet Commonly known as: ALEVE You can take 1-2 tablets every 12 hours as needed for pain.  You can use this in addition to the plain Tylenol/acetaminophen.  You can buy this over-the-counter at any drugstore. What changed:   how much to take  how to take this  when to take this  reasons to take this  additional instructions   oxyCODONE 5 MG immediate release tablet Commonly known as: Oxy IR/ROXICODONE Take 1 tablet every 6 hours as needed for pain not relieved by plain Tylenol, and Aleve/naproxen.   valsartan 320 MG tablet Commonly known as: DIOVAN Take 320 mg by mouth daily.       Follow-up Information    [facility], [place] Follow up on 09/09/2020.   Specialty: General Surgery Why: Your appointment is at 10:30AM .  Be at the office 30 minutes early for check-in.  Bring photo ID and insurance information. Contact information: [address] [place] [address] [phone]              Allergies  Allergen Reactions  . Hydrochlorothiazide     Other reaction(s): gout Caused patient to have gout   . Lisinopril Other (See Comments)    cough   . Sulfadiazine Rash     Procedures /Studies:   DG Cholangiogram Operative  Result Date: 08/23/2020 CLINICAL DATA:  65 year old male with history of cholelithiasis. EXAM: INTRAOPERATIVE CHOLANGIOGRAM TECHNIQUE: Cholangiographic images from the C-arm fluoroscopic device were submitted for interpretation post-operatively. Please see the procedural report for the amount of contrast and the fluoroscopy time  utilized. COMPARISON:  MRI abdomen from 08/22/2020 FINDINGS: Intraoperative antegrade injection via the cystic duct which opacifies the common bile duct and central portions of the intrahepatic biliary tree. Contrast is visualized flowing freely into the duodenum. There are no filling defects. Mild superior common bile duct dilation at the level of the confluence. No significant intrahepatic biliary ductal dilation. No apparent anomalous anatomical configuration of the biliary tree. IMPRESSION: No evidence of choledocholithiasis. Ruthann Cancer, MD [facility] Electronically Signed   By: Ruthann Cancer MD   On: 08/23/2020 12:43   MR 3D Recon At Scanner  Addendum Date: 08/22/2020   ADDENDUM REPORT: 08/22/2020 10:30 ADDENDUM: The original report was by Dr. Van Clines. The following addendum is by Dr. Van Clines: I discussed this case with Dr. Lizbeth Bark at 10:20 a.m. on 08/22/2020. Our discussion revolved around the appearance of the distal CBD on image 17 of series 11, where the conical tapering of the duct appears slightly truncated in a  fashion that can sometimes indicate a stone. Unfortunately this image is limited by volume averaging, and the diagnostic thin-section MRCP images in this region are highly indistinct/obscured by motion artifact. In my original analysis of this case, I analyzed the axial images as a tie breaker in assessing this region, and the thin AP diameter of the distal CBD at about 2 mm in the lack of a well-defined filling defect as well as the motion artifact lead me to more conservatively indicate that no well-defined filling defect was identified. That said, the presence of small distal CBD stones is difficult to confidently exclude, and if based on complementary clinical indicators there is a high suspicion, procedural intervention for further characterization and potentially treatment might be appropriate.  Electronically Signed   By: Van Clines M.D.   On: 08/22/2020 10:30   Result Date: 08/22/2020 CLINICAL DATA:  Right upper quadrant and epigastric pain for greater than 1 month, increasing. Ultrasound showed bile duct dilatation along with cholelithiasis. EXAM: MRI ABDOMEN WITHOUT AND WITH CONTRAST (INCLUDING MRCP) TECHNIQUE: Multiplanar multisequence MR imaging of the abdomen was performed both before and after the administration of intravenous contrast. Heavily T2-weighted images of the biliary and pancreatic ducts were obtained, and three-dimensional MRCP images were rendered by post processing. CONTRAST:  22m GADAVIST GADOBUTROL 1 MMOL/ML IV SOLN COMPARISON:  08/22/2020 ultrasound FINDINGS: Despite efforts by the technologist and patient, motion artifact is present on today's exam and could not be eliminated. This reduces exam sensitivity and specificity. Lower chest: Unremarkable suspected cardiomegaly. Otherwise unremarkable. Hepatobiliary: Multiple gallstones in the gallbladder measuring up to 2.6 cm in diameter. Gallbladder wall thickening is present. The common hepatic duct measures up to 0.8 cm for example on image 49 of series 6, with the confluence of the cystic duct and common hepatic duct well seen on image 22 of series 15. The common bile duct measures up to 0.6 cm in diameter for example on images 22-23 of series 15. I not see a well-defined filling defect in the common bile duct, although the dedicated MRCP images are blurred by motion artifact. There is a suggestion of mild periportal edema. Clustered likely reactive lymph nodes in the porta hepatis and peripancreatic region including a 1.0 cm portacaval lymph node. Pancreas:  Unremarkable Spleen:  Unremarkable Adrenals/Urinary Tract: The adrenal glands appear unremarkable. 1.1 cm fluid signal intensity lesion of the right kidney lower pole laterally compatible with cyst. Stomach/Bowel: Scattered diverticula of the descending colon.  Vascular/Lymphatic:  Reactive lymph nodes in the porta hepatis. Other:  Trace perihepatic ascites. Musculoskeletal: There is evidence of lumbar spondylosis and degenerative disc disease, with suspicion for central narrowing of the thecal sac at L1-2 and L2-3. IMPRESSION: 1. Cholelithiasis with gallbladder wall thickening and pericholecystic edema. Correlate clinically in assessing for acute cholecystitis. 2. Mild dilatation of the common hepatic duct, with the common bile duct currently within normal limits at 0.6 cm in diameter. No definite filling defect in the CBD, although motion artifact reduces sensitivity. 3. Clustered likely reactive lymph nodes in the porta hepatis and peripancreatic region including a 1.0 cm portacaval lymph node. 4. Despite efforts by the technologist and patient, motion artifact is present on today's exam and could not be eliminated. This reduces exam sensitivity and specificity. 5. Scattered diverticula of the descending colon. 6. Trace perihepatic ascites. 7. Lumbar spondylosis and degenerative disc disease, with suspicion for central narrowing of the thecal sac at L1-2 and L2-3. Electronically Signed: By: WVan ClinesM.D. On: 08/22/2020 07:49  US Abdomen Limited  Result Date: 08/22/2020 CLINICAL DATA:  Right upper quadrant and epigastric pain. EXAM: ULTRASOUND ABDOMEN LIMITED RIGHT UPPER QUADRANT COMPARISON:  None. FINDINGS: Gallbladder: Physiologic lead distended. Gallstones including a large 2.9 cm stone. There is mild wall thickening at 5 mm. No pericholecystic fluid. No sonographic Murphy sign noted by sonographer. Common bile duct: Diameter: 13 mm proximally, 14 mm distally. No visualized choledocholithiasis. Liver: No focal lesion identified. Within normal limits in parenchymal echogenicity. Portal vein is patent on color Doppler imaging with normal direction of blood flow towards the liver. Other: No upper quadrant ascites. Incidental note of prominent column of  Bertin in the right kidney. IMPRESSION: 1. Gallstones with mild gallbladder wall thickening. No sonographic Murphy sign. Findings may represent acute cholecystitis in the appropriate clinical setting. 2. Dilated common bile duct at 14 mm. No visualized choledocholithiasis. MRCP could be considered for biliary tree evaluation if patient is able to tolerate breath hold technique. Electronically Signed   By: Keith Rake M.D.   On: 08/22/2020 00:42   DG ERCP BILIARY & PANCREATIC DUCTS  Result Date: 08/24/2020 CLINICAL DATA:  65 year old male with cholelithiasis/choledocholithiasis EXAM: ERCP TECHNIQUE: Multiple spot images obtained with the fluoroscopic device and submitted for interpretation post-procedure. FLUOROSCOPY TIME:  Fluoroscopy Time:  7 minutes 34 seconds COMPARISON:  None. FINDINGS: Limited intraoperative fluoroscopic spot images during ERCP. Initial image demonstrates endoscope projecting over the upper abdomen with partial opacification of the extrahepatic biliary ducts. Subsequently there is deployment of a retrieval balloon. IMPRESSION: Limited images during ERCP demonstrates deployment of a retrieval balloon within the common bile duct. Please refer to the dictated operative report for full details of intraoperative findings and procedure. Electronically Signed   By: Corrie Mckusick D.O.   On: 08/24/2020 09:43   MR ABDOMEN MRCP W WO CONTAST  Addendum Date: 08/22/2020   ADDENDUM REPORT: 08/22/2020 10:30 ADDENDUM: The original report was by Dr. Van Clines. The following addendum is by Dr. Van Clines: I discussed this case with Dr. Lizbeth Bark at 10:20 a.m. on 08/22/2020. Our discussion revolved around the appearance of the distal CBD on image 17 of series 11, where the conical tapering of the duct appears slightly truncated in a fashion that can sometimes indicate a stone. Unfortunately this image is limited by volume averaging, and the diagnostic thin-section MRCP images in this  region are highly indistinct/obscured by motion artifact. In my original analysis of this case, I analyzed the axial images as a tie breaker in assessing this region, and the thin AP diameter of the distal CBD at about 2 mm in the lack of a well-defined filling defect as well as the motion artifact lead me to more conservatively indicate that no well-defined filling defect was identified. That said, the presence of small distal CBD stones is difficult to confidently exclude, and if based on complementary clinical indicators there is a high suspicion, procedural intervention for further characterization and potentially treatment might be appropriate. Electronically Signed   By: Van Clines M.D.   On: 08/22/2020 10:30   Result Date: 08/22/2020 CLINICAL DATA:  Right upper quadrant and epigastric pain for greater than 1 month, increasing. Ultrasound showed bile duct dilatation along with cholelithiasis. EXAM: MRI ABDOMEN WITHOUT AND WITH CONTRAST (INCLUDING MRCP) TECHNIQUE: Multiplanar multisequence MR imaging of the abdomen was performed both before and after the administration of intravenous contrast. Heavily T2-weighted images of the biliary and pancreatic ducts were obtained, and three-dimensional MRCP images were rendered by post processing. CONTRAST:  34m GADAVIST GADOBUTROL 1 MMOL/ML IV SOLN COMPARISON:  08/22/2020 ultrasound FINDINGS: Despite efforts by the technologist and patient, motion artifact is present on today's exam and could not be eliminated. This reduces exam sensitivity and specificity. Lower chest: Unremarkable suspected cardiomegaly. Otherwise unremarkable. Hepatobiliary: Multiple gallstones in the gallbladder measuring up to 2.6 cm in diameter. Gallbladder wall thickening is present. The common hepatic duct measures up to 0.8 cm for example on image 49 of series 6, with the confluence of the cystic duct and common hepatic duct well seen on image 22 of series 15. The common bile duct  measures up to 0.6 cm in diameter for example on images 22-23 of series 15. I not see a well-defined filling defect in the common bile duct, although the dedicated MRCP images are blurred by motion artifact. There is a suggestion of mild periportal edema. Clustered likely reactive lymph nodes in the porta hepatis and peripancreatic region including a 1.0 cm portacaval lymph node. Pancreas:  Unremarkable Spleen:  Unremarkable Adrenals/Urinary Tract: The adrenal glands appear unremarkable. 1.1 cm fluid signal intensity lesion of the right kidney lower pole laterally compatible with cyst. Stomach/Bowel: Scattered diverticula of the descending colon. Vascular/Lymphatic:  Reactive lymph nodes in the porta hepatis. Other:  Trace perihepatic ascites. Musculoskeletal: There is evidence of lumbar spondylosis and degenerative disc disease, with suspicion for central narrowing of the thecal sac at L1-2 and L2-3. IMPRESSION: 1. Cholelithiasis with gallbladder wall thickening and pericholecystic edema. Correlate clinically in assessing for acute cholecystitis. 2. Mild dilatation of the common hepatic duct, with the common bile duct currently within normal limits at 0.6 cm in diameter. No definite filling defect in the CBD, although motion artifact reduces sensitivity. 3. Clustered likely reactive lymph nodes in the porta hepatis and peripancreatic region including a 1.0 cm portacaval lymph node. 4. Despite efforts by the technologist and patient, motion artifact is present on today's exam and could not be eliminated. This reduces exam sensitivity and specificity. 5. Scattered diverticula of the descending colon. 6. Trace perihepatic ascites. 7. Lumbar spondylosis and degenerative disc disease, with suspicion for central narrowing of the thecal sac at L1-2 and L2-3. Electronically Signed: By: WVan ClinesM.D. On: 08/22/2020 07:49     Subjective:   Patient was seen and examined 08/25/2020, 8:57 AM Patient stable  today. No acute distress.  No issues overnight Stable for discharge.  Discharge Exam:    Vitals:   08/24/20 1755 08/24/20 2140 08/25/20 0459 08/25/20 0833  BP: (!) 145/87 140/74 (!) 149/92 (!) 154/87  Pulse: 65 (!) 58 63 64  Resp: '16 18 15 18  ' Temp: 98.2 F (36.8 C) 98.6 F (37 C) 98.4 F (36.9 C) 98.8 F (37.1 C)  TempSrc: Oral Oral Oral Oral  SpO2: 97% 94% 96% 97%  Weight:      Height:        General: Pt lying comfortably in bed & appears in no obvious distress. Cardiovascular: S1 & S2 heard, RRR, S1/S2 +. No murmurs, rubs, gallops or clicks. No JVD or pedal edema. Respiratory: Clear to auscultation without wheezing, rhonchi or crackles. No increased work of breathing. Abdominal:  Non-distended, non-tender & soft. No organomegaly or masses appreciated. Normal bowel sounds heard. CNS: Alert and oriented. No focal deficits. Extremities: no edema, no cyanosis      The results of significant diagnostics from this hospitalization (including imaging, microbiology, ancillary and laboratory) are listed below for reference.      Microbiology:   Recent Results (from the  past 240 hour(s))  SARS CORONAVIRUS 2 (TAT 6-24 HRS) Nasopharyngeal Nasopharyngeal Swab     Status: None   Collection Time: 08/22/20  2:04 AM   Specimen: Nasopharyngeal Swab  Result Value Ref Range Status   SARS Coronavirus 2 NEGATIVE NEGATIVE Final    Comment: (NOTE) SARS-CoV-2 target nucleic acids are NOT DETECTED.  The SARS-CoV-2 RNA is generally detectable in upper and lower respiratory specimens during the acute phase of infection. Negative results do not preclude SARS-CoV-2 infection, do not rule out co-infections with other pathogens, and should not be used as the sole basis for treatment or other patient management decisions. Negative results must be combined with clinical observations, patient history, and epidemiological information. The expected result is Negative.  Fact Sheet for  Patients: SugarRoll.be  Fact Sheet for Healthcare Providers: https://www.woods-mathews.com/  This test is not yet approved or cleared by the [place] FDA and  has been authorized for detection and/or diagnosis of SARS-CoV-2 by FDA under an Emergency Use Authorization (EUA). This EUA will remain  in effect (meaning this test can be used) for the duration of the COVID-19 declaration under Se ction 564(b)(1) of the Act, 21 U.S.C. section 360bbb-3(b)(1), unless the authorization is terminated or revoked sooner.  Performed at [facility], [place] [address] [place], [place] [address]   Surgical pcr screen     Status: Abnormal   Collection Time: 08/23/20  8:31 AM   Specimen: Nasal Mucosa; Nasal Swab  Result Value Ref Range Status   MRSA, PCR NEGATIVE NEGATIVE Final   Staphylococcus aureus POSITIVE (A) NEGATIVE Final    Comment: (NOTE) The Xpert SA Assay (FDA approved for NASAL specimens in patients 44 years of age and older), is one component of a comprehensive surveillance program. It is not intended to diagnose infection nor to guide or monitor treatment. Performed at [facility] [place] [address] [place], [place] [address]      Labs:   CBC: Recent Labs  Lab 08/21/20 2211 08/22/20 0302 08/23/20 0437 08/24/20 0526 08/25/20 0446  WBC 8.1 7.9 6.2 8.6 7.1  HGB 14.2 14.8 12.5* 14.0 12.4*  HCT 44.7 46.0 39.0 46.3 38.5*  MCV 84.7 84.2 83.9 88.4 83.7  PLT 199 199 168 188 683   Basic Metabolic Panel: Recent Labs  Lab 08/21/20 2211 08/22/20 0302 08/23/20 0437 08/24/20 0526 08/25/20 0446  NA 139  --  140 136 139  K 3.4*  --  3.7 3.6 3.4*  CL 100  --  106 102 105  CO2 26  --  24 21* 21*  GLUCOSE 167*  --  122* 135* 127*  BUN 17  --  '12 11 10  ' CREATININE 1.17  --  1.06 1.16 1.14  CALCIUM 9.4  --  8.8* 9.0 8.9  MG  --  2.2  --   --   --    Liver Function Tests: Recent Labs  Lab 08/21/20 2211  08/22/20 0302 08/23/20 0437 08/24/20 0526 08/25/20 0446  AST 365* 400* 135* 72* 41  ALT 444* 505* 329* 279* 183*  ALKPHOS 297* 302* 218* 213* 163*  BILITOT 2.5* 3.3* 2.7* 1.4* 1.0  PROT 7.5 7.2 6.2* 7.4 6.8  ALBUMIN 3.9 3.7 3.2* 3.7 3.4*   BNP (last 3 results) No results for input(s): BNP in the last 8760 hours. Cardiac Enzymes: No results for input(s): CKTOTAL, CKMB, CKMBINDEX, TROPONINI in the last 168 hours. CBG: Recent Labs  Lab 08/24/20 1551 08/24/20 2001 08/25/20 0013 08/25/20 0357 08/25/20  0736  GLUCAP 183* 149* 126* 108* 131*   Hgb A1c No results for input(s): HGBA1C in the last 72 hours. Lipid Profile No results for input(s): CHOL, HDL, LDLCALC, TRIG, CHOLHDL, LDLDIRECT in the last 72 hours. Thyroid function studies No results for input(s): TSH, T4TOTAL, T3FREE, THYROIDAB in the last 72 hours.  Invalid input(s): FREET3 Anemia work up No results for input(s): VITAMINB12, FOLATE, FERRITIN, TIBC, IRON, RETICCTPCT in the last 72 hours. Urinalysis    Component Value Date/Time   COLORURINE AMBER (A) 08/21/2020 2211   APPEARANCEUR CLEAR 08/21/2020 2211   LABSPEC 1.020 08/21/2020 2211   PHURINE 6.0 08/21/2020 2211   GLUCOSEU NEGATIVE 08/21/2020 2211   HGBUR NEGATIVE 08/21/2020 2211   [place] 08/21/2020 [place] 08/21/2020 2211   PROTEINUR NEGATIVE 08/21/2020 2211   NITRITE NEGATIVE 08/21/2020 2211   LEUKOCYTESUR NEGATIVE 08/21/2020 2211         Time coordinating discharge: Over 45 minutes  SIGNED: Deatra James, MD, FACP, [facility]. [facility],  Please use [email or web address] to Page If 7PM-7AM, please contact night-coverage [email or web address] Ota [facility] 08/25/2020, 8:57 AM

## 2020-08-25 NOTE — Progress Notes (Signed)
1 Day Post-Op    CC: Abdominal pain   Subjective: Patient doing well this a.m. a soft diet.  Port sites all look fine.  Passing flatus no BM.  Objective: Vital signs in last 24 hours: Temp:  [97.8 F (36.6 C)-98.6 F (37 C)] 98.4 F (36.9 C) (02/21 0459) Pulse Rate:  [58-86] 63 (02/21 0459) Resp:  [10-18] 15 (02/21 0459) BP: (137-174)/(74-96) 149/92 (02/21 0459) SpO2:  [91 %-99 %] 96 % (02/21 0459) Last BM Date: 08/21/20 300 p.o. recorded 2100 IV Urine 200 No BM Afebrile, vital signs are stable  CMP Latest Ref Rng & Units 08/24/2020 08/23/2020 08/22/2020  Total Bilirubin 0.3 - 1.2 mg/dL 1.4(H) 2.7(H) 3.3(H)  Alkaline Phos 38 - 126 U/L 213(H) 218(H) 302(H)  AST 15 - 41 U/L 72(H) 135(H) 400(H)  ALT 0 - 44 U/L 279(H) 329(H) 505(H)  CMP today total bilirubin 1.0, AST 41, ALT 183, alk phos 163 Intake/Output from previous day: 02/20 0701 - 02/21 0700 In: 2424 [P.O.:300; I.V.:1724; IV Piggyback:400] Out: 200 [Urine:200] Intake/Output this shift: Total I/O In: 240 [P.O.:240] Out: -   General appearance: alert, cooperative and no distress Resp: clear to auscultation bilaterally GI: Soft, sore, port sites all look fine.  Lab Results:  Recent Labs    08/24/20 0526 08/25/20 0446  WBC 8.6 7.1  HGB 14.0 12.4*  HCT 46.3 38.5*  PLT 188 173    BMET Recent Labs    08/24/20 0526 08/25/20 0446  NA 136 139  K 3.6 3.4*  CL 102 105  CO2 21* 21*  GLUCOSE 135* 127*  BUN 11 10  CREATININE 1.16 1.14  CALCIUM 9.0 8.9   PT/INR No results for input(s): LABPROT, INR in the last 72 hours.  Recent Labs  Lab 08/21/20 2211 08/22/20 0302 08/23/20 0437 08/24/20 0526 08/25/20 0446  AST 365* 400* 135* 72* 41  ALT 444* 505* 329* 279* 183*  ALKPHOS 297* 302* 218* 213* 163*  BILITOT 2.5* 3.3* 2.7* 1.4* 1.0  PROT 7.5 7.2 6.2* 7.4 6.8  ALBUMIN 3.9 3.7 3.2* 3.7 3.4*     Lipase     Component Value Date/Time   LIPASE 32 08/21/2020 2211   . cefTRIAXone (ROCEPHIN)  IV 2 g  (08/25/20 0315)  . lactated ringers 125 mL/hr at 08/25/20 0315  . metronidazole 500 mg (08/25/20 0403)     Medications: . acetaminophen  1,000 mg Oral Q8H  . amLODipine  10 mg Oral Daily  . docusate sodium  100 mg Oral BID  . heparin  5,000 Units Subcutaneous Q8H  . hydrALAZINE  50 mg Oral BID  . influenza vaccine adjuvanted  0.5 mL Intramuscular Tomorrow-1000  . insulin aspart  0-9 Units Subcutaneous Q4H  . irbesartan  300 mg Oral Daily  . mupirocin ointment   Nasal BID  Choledocholithiasis was found. Complete removal was accomplished by biliary sphincterotomy and balloon extraction. - One pancreatic stent was placed into the ventral pancreatic duct. It was accidentally removed with withdrawing of the sphincterotome as above - A biliary sphincterotomy was performed. - The biliary tree was swept and nothing was found on occlusion cholangiogram at the end of the procedure. Not Applicable  Assessment/Plan Hypertension Hyperlipidemia Elevated LFTs  Chronic calculus cholecystitis/choledocholithiasis Laparoscopic cholecystectomy with intraoperative cholangiogram 08/23/2020 Dr. Greer Pickerel, POD #2 ERCP, sphincterotomy, stone removal 08/24/2020 Dr. Clarene Essex, POD #1  FEN: IV fluids/soft diet DVT: Heparin ID: Rocephin 2/18>> day 4 Follow-up: [place] [facility] 09/09/2020 at 10:30 AM.   Plan: Home today, follow-up  and pain medications in the AVS.    LOS: 0 days    Lawrence Macdonald 08/25/2020 Please see Amion

## 2020-08-25 NOTE — Progress Notes (Signed)
Pt alert and oriented, tolerating diet. D/C instructions given. Pt d/cd home. 

## 2020-08-26 ENCOUNTER — Encounter (HOSPITAL_COMMUNITY): Payer: Self-pay | Admitting: Gastroenterology

## 2020-08-26 LAB — SURGICAL PATHOLOGY

## 2021-05-07 ENCOUNTER — Ambulatory Visit (HOSPITAL_COMMUNITY)
Admission: EM | Admit: 2021-05-07 | Discharge: 2021-05-07 | Disposition: A | Discharge status: Home / Self Care | Payer: Medicare Other | Attending: Internal Medicine | Admitting: Internal Medicine

## 2021-05-07 ENCOUNTER — Other Ambulatory Visit: Payer: Self-pay

## 2021-05-07 ENCOUNTER — Encounter (HOSPITAL_COMMUNITY): Payer: Self-pay | Admitting: Emergency Medicine

## 2021-05-07 DIAGNOSIS — B302 Viral pharyngoconjunctivitis: Secondary | ICD-10-CM

## 2021-05-07 MED ORDER — BENZONATATE 100 MG PO CAPS: 100.0000 mg | ORAL_CAPSULE | Freq: Three times a day (TID) | ORAL | 0 refills | Status: DC | PRN

## 2021-05-07 MED ORDER — ERYTHROMYCIN 5 MG/GM OP OINT: TOPICAL_OINTMENT | Freq: Every day | OPHTHALMIC | 0 refills | Status: AC

## 2021-05-07 NOTE — Discharge Instructions (Addendum)
Please use medications as prescribed Maintain adequate hydration Return to urgent care if symptoms worsen Cough may take up to a couple of weeks to resolve completely.

## 2021-05-07 NOTE — ED Triage Notes (Signed)
Pt is present today with cough, nasal congestion, and left eye drainage. Pt state sx started 2 days ago

## 2021-05-07 NOTE — ED Provider Notes (Signed)
Lawrence Macdonald    CSN: [id number] Arrival date & time: 05/07/21  1054      History   Chief Complaint Chief Complaint  Patient presents with   Cough   Eye Drainage   Nasal Congestion    HPI Lawrence Macdonald is a 65 y.o. male comes to the urgent care with a 2-day history of nasal congestion, rhinorrhea, cough and left eye drainage.  Patient's symptoms started 2 days ago and has been persistent.  No febrile episodes.  Symptoms are associated with a cough which is nonproductive.  No shortness of breath or wheezing.  Patient denies any generalized body aches.  Family members have similar symptoms.  No wheezing.Marland Kitchen   HPI  Past Medical History:  Diagnosis Date   Diabetes mellitus without complication ([place])    Hypertension     Patient Active Problem List   Diagnosis Date Noted   Cholelithiasis 08/22/2020   Elevated liver enzymes 08/22/2020   Hypokalemia 08/22/2020   HTN (hypertension) 08/22/2020   HLD (hyperlipidemia) 08/22/2020    Past Surgical History:  Procedure Laterality Date   CHOLECYSTECTOMY N/A 08/23/2020   Procedure: LAPAROSCOPIC CHOLECYSTECTOMY WITH INTRAOPERATIVE CHOLANGIOGRAM;  Surgeon: Greer Pickerel, MD;  Location: [facility];  Service: General;  Laterality: N/A;   ERCP N/A 08/24/2020   Procedure: ENDOSCOPIC RETROGRADE CHOLANGIOPANCREATOGRAPHY (ERCP);  Surgeon: Clarene Essex, MD;  Location: [facility];  Service: Endoscopy;  Laterality: N/A;   REMOVAL OF STONES  08/24/2020   Procedure: REMOVAL OF STONES;  Surgeon: Clarene Essex, MD;  Location: [facility];  Service: Endoscopy;;   SPHINCTEROTOMY  08/24/2020   Procedure: Joan Mayans;  Surgeon: Clarene Essex, MD;  Location: [facility];  Service: Endoscopy;;   TONSILLECTOMY         Home Medications    Prior to Admission medications   Medication Sig Start Date End Date Taking? Authorizing Provider  benzonatate (TESSALON) 100 MG capsule Take 1 capsule (100 mg total) by mouth 3 (three) times daily as needed for  cough. 05/07/21  Yes Salik Grewell, Myrene Galas, MD  erythromycin ophthalmic ointment Place into both eyes at bedtime for 5 days. Place a 1/2 inch ribbon of ointment into the lower eyelid. 05/07/21 05/12/21 Yes Alynna Hargrove, Myrene Galas, MD  acetaminophen (TYLENOL) 500 MG tablet You can take 1000 mg of Tylenol/acetaminophen every 6 hours as needed for pain.  Do not exceed 4000 mg of Tylenol per day it can harm your liver.  You can buy this over-the-counter at any drugstore.  You can use naproxen in addition to plain Tylenol for pain. 08/25/20   Earnstine Regal, PA-C  albuterol (VENTOLIN HFA) 108 [phone] Base) MCG/ACT inhaler  05/28/16   [provider]  amLODipine (NORVASC) 10 MG tablet Take 10 mg by mouth daily. 06/26/19 08/22/20  [provider]  atorvastatin (LIPITOR) 40 MG tablet Take 40 mg by mouth daily.    [provider]  docusate sodium (COLACE) 100 MG capsule Take 1 capsule (100 mg total) by mouth 2 (two) times daily. 08/25/20   Shahmehdi, Valeria Batman, MD  hydrALAZINE (APRESOLINE) 50 MG tablet Take 50 mg by mouth 2 (two) times daily. 06/26/20   [provider]  metFORMIN (GLUCOPHAGE) 500 MG tablet Take 500 mg by mouth daily. 02/13/20   [provider]  naproxen sodium (ALEVE) 220 MG tablet You can take 1-2 tablets every 12 hours as needed for pain.  You can use this in addition to the plain Tylenol/acetaminophen.  You can buy this over-the-counter at any drugstore. 08/25/20  Sherrie George, PA-C  valsartan (DIOVAN) 320 MG tablet Take 320 mg by mouth daily. 11/25/19   [provider]    Family History History reviewed. No pertinent family history.  Social History Social History   Tobacco Use   Smoking status: Never   Smokeless tobacco: Never  Substance Use Topics   Alcohol use: Yes   Drug use: Never     Allergies   Hydrochlorothiazide, Lisinopril, and Sulfadiazine   Review of Systems Review of Systems As per HPI.  Physical Exam Triage Vital  Signs ED Triage Vitals  Enc Vitals Group     BP 05/07/21 1256 (!) 155/80     Pulse Rate 05/07/21 1256 (!) 56     Resp 05/07/21 1256 18     Temp 05/07/21 1256 98 F (36.7 C)     Temp src --      SpO2 05/07/21 1256 99 %     Weight --      Height --      Head Circumference --      Peak Flow --      Pain Score 05/07/21 1255 0     Pain Loc --      Pain Edu? --      Excl. in [place]? --    No data found.  Updated Vital Signs BP (!) 155/80   Pulse (!) 56   Temp 98 F (36.7 C)   Resp 18   SpO2 99%   Visual Acuity Right Eye Distance:   Left Eye Distance:   Bilateral Distance:    Right Eye Near:   Left Eye Near:    Bilateral Near:     Physical Exam Vitals reviewed.  Constitutional:      General: He is not in acute distress.    Appearance: He is not ill-appearing.  HENT:     Right Ear: Tympanic membrane normal.     Left Ear: Tympanic membrane normal.     Nose: Congestion present. No rhinorrhea.     Mouth/Throat:     Pharynx: No posterior oropharyngeal erythema.  Eyes:     Comments: Bilateral conjunctival erythema  Cardiovascular:     Rate and Rhythm: Normal rate and regular rhythm.     Pulses: Normal pulses.  Pulmonary:     Effort: Pulmonary effort is normal.     Breath sounds: Normal breath sounds.  Musculoskeletal:        General: Normal range of motion.  Neurological:     Mental Status: He is alert.     UC Treatments / Results  Labs (all labs ordered are listed, but only abnormal results are displayed) Labs Reviewed - No data to display  EKG   Radiology No results found.  Procedures Procedures (including critical care time)  Medications Ordered in UC Medications - No data to display  Initial Impression / Assessment and Plan / UC Course  I have reviewed the triage vital signs and the nursing notes.  Pertinent labs & imaging results that were available during my care of the patient were reviewed by me and considered in my medical decision making  (see chart for details).     1.  Viral pharyngoconjunctivitis: This is likely RSV or adenovirus infection Tessalon Perles as needed for cough Erythromycin eye ointment Maintain adequate hydration Tylenol/Motrin as needed for fever Return to urgent care if symptoms worsen. Final Clinical Impressions(s) / UC Diagnoses   Final diagnoses:  Viral pharyngoconjunctivitis     Discharge Instructions  Please use medications as prescribed Maintain adequate hydration Return to urgent care if symptoms worsen Cough may take up to a couple of weeks to resolve completely.   ED Prescriptions     Medication Sig Dispense Auth. Provider   erythromycin ophthalmic ointment Place into both eyes at bedtime for 5 days. Place a 1/2 inch ribbon of ointment into the lower eyelid. 3.5 g Talyssa Gibas, Britta Mccreedy, MD   benzonatate (TESSALON) 100 MG capsule Take 1 capsule (100 mg total) by mouth 3 (three) times daily as needed for cough. 21 capsule Catie Chiao, Britta Mccreedy, MD      PDMP not reviewed this encounter.   Merrilee Jansky, MD 05/07/21 1359

## 2021-05-10 ENCOUNTER — Ambulatory Visit (INDEPENDENT_AMBULATORY_CARE_PROVIDER_SITE_OTHER): Payer: Medicare Other

## 2021-05-10 ENCOUNTER — Ambulatory Visit
Admission: EM | Admit: 2021-05-10 | Discharge: 2021-05-10 | Disposition: A | Discharge status: Home / Self Care | Payer: Medicare Other | Attending: Internal Medicine | Admitting: Internal Medicine

## 2021-05-10 DIAGNOSIS — R0602 Shortness of breath: Secondary | ICD-10-CM

## 2021-05-10 DIAGNOSIS — R059 Cough, unspecified: Secondary | ICD-10-CM | POA: Diagnosis not present

## 2021-05-10 DIAGNOSIS — J029 Acute pharyngitis, unspecified: Secondary | ICD-10-CM | POA: Diagnosis present

## 2021-05-10 DIAGNOSIS — H5713 Ocular pain, bilateral: Secondary | ICD-10-CM | POA: Insufficient documentation

## 2021-05-10 DIAGNOSIS — J069 Acute upper respiratory infection, unspecified: Secondary | ICD-10-CM | POA: Insufficient documentation

## 2021-05-10 LAB — POCT RAPID STREP A (OFFICE): Rapid Strep A Screen: NEGATIVE

## 2021-05-10 MED ORDER — GUAIFENESIN 200 MG PO TABS: 200.0000 mg | ORAL_TABLET | ORAL | 0 refills | Status: DC | PRN

## 2021-05-10 NOTE — Discharge Instructions (Signed)
Your chest x-ray did not show any signs of pneumonia.  You have been prescribed a new cough medication to try.  Please follow-up with primary care for further evaluation management of abnormal heart on chest x-ray.  Follow-up if symptoms persist.

## 2021-05-10 NOTE — ED Triage Notes (Signed)
Pt c/o chest congestion, cough, sore throat, intermittent SOB, drainage from eyes   Denies headache, nausea, vomiting, constipation, body aches or chills, fever, ear ache.   States was dx with "viral conjunctivitis" at [place] location a few days ago and rx was not helpful.

## 2021-05-10 NOTE — ED Provider Notes (Signed)
[facility]    CSN: [id number] Arrival date & time: 05/10/21  8416      History   Chief Complaint Chief Complaint  Patient presents with   Cough    HPI Lawrence Macdonald is a 65 y.o. male.   Patient presents with 6-day history of cough, shortness of breath, bilateral eye discomfort and drainage, sore throat, nasal congestion.  Cough is productive per patient.  Shortness of breath is intermittent and mainly occurs with coughing fits.  Patient denies any history of chronic lung disease.  Denies any chest pain.  Denies any known fevers.  Patient had a known sick contact with a family member with similar symptoms.  Denies any nausea, vomiting, diarrhea, abdominal pain.  Patient was recently seen on 05/06/2021 at a different urgent care and was prescribed erythromycin ointment for eyes as well as benzonatate to take as needed for cough.  Patient reports that cough has not improved but eye discomfort has improved.  Denies any blurry vision.   Cough  Past Medical History:  Diagnosis Date   Diabetes mellitus without complication ([facility])    Hypertension     Patient Active Problem List   Diagnosis Date Noted   Cholelithiasis 08/22/2020   Elevated liver enzymes 08/22/2020   Hypokalemia 08/22/2020   HTN (hypertension) 08/22/2020   HLD (hyperlipidemia) 08/22/2020    Past Surgical History:  Procedure Laterality Date   CHOLECYSTECTOMY N/A 08/23/2020   Procedure: LAPAROSCOPIC CHOLECYSTECTOMY WITH INTRAOPERATIVE CHOLANGIOGRAM;  Surgeon: Gaynelle Adu, MD;  Location: [facility];  Service: General;  Laterality: N/A;   ERCP N/A 08/24/2020   Procedure: ENDOSCOPIC RETROGRADE CHOLANGIOPANCREATOGRAPHY (ERCP);  Surgeon: Vida Rigger, MD;  Location: [facility];  Service: Endoscopy;  Laterality: N/A;   REMOVAL OF STONES  08/24/2020   Procedure: REMOVAL OF STONES;  Surgeon: Vida Rigger, MD;  Location: [facility];  Service: Endoscopy;;   SPHINCTEROTOMY  08/24/2020   Procedure: Dennison Mascot;   Surgeon: Vida Rigger, MD;  Location: [facility];  Service: Endoscopy;;   TONSILLECTOMY         Home Medications    Prior to Admission medications   Medication Sig Start Date End Date Taking? Authorizing Provider  guaiFENesin 200 MG tablet Take 1 tablet (200 mg total) by mouth every 4 (four) hours as needed for cough or to loosen phlegm. 05/10/21  Yes Cyrah Mclamb, Rolly Salter E, FNP  acetaminophen (TYLENOL) 500 MG tablet You can take 1000 mg of Tylenol/acetaminophen every 6 hours as needed for pain.  Do not exceed 4000 mg of Tylenol per day it can harm your liver.  You can buy this over-the-counter at any drugstore.  You can use naproxen in addition to plain Tylenol for pain. 08/25/20   Sherrie George, PA-C  albuterol (VENTOLIN HFA) 108 [phone] Base) MCG/ACT inhaler  05/28/16   [provider]  amLODipine (NORVASC) 10 MG tablet Take 10 mg by mouth daily. 06/26/19 08/22/20  [provider]  atorvastatin (LIPITOR) 40 MG tablet Take 40 mg by mouth daily.    [provider]  benzonatate (TESSALON) 100 MG capsule Take 1 capsule (100 mg total) by mouth 3 (three) times daily as needed for cough. 05/07/21   Lamptey, Britta Mccreedy, MD  docusate sodium (COLACE) 100 MG capsule Take 1 capsule (100 mg total) by mouth 2 (two) times daily. 08/25/20   Kendell Bane, MD  erythromycin ophthalmic ointment Place into both eyes at bedtime for 5 days. Place a 1/2 inch ribbon of ointment into the lower eyelid. 05/07/21  05/12/21  LampteyMyrene Galas, MD  hydrALAZINE (APRESOLINE) 50 MG tablet Take 50 mg by mouth 2 (two) times daily. 06/26/20   [provider]  metFORMIN (GLUCOPHAGE) 500 MG tablet Take 500 mg by mouth daily. 02/13/20   [provider]  naproxen sodium (ALEVE) 220 MG tablet You can take 1-2 tablets every 12 hours as needed for pain.  You can use this in addition to the plain Tylenol/acetaminophen.  You can buy this over-the-counter at any drugstore. 08/25/20   Earnstine Regal, PA-C   valsartan (DIOVAN) 320 MG tablet Take 320 mg by mouth daily. 11/25/19   [provider]    Family History History reviewed. No pertinent family history.  Social History Social History   Tobacco Use   Smoking status: Never   Smokeless tobacco: Never  Substance Use Topics   Alcohol use: Yes   Drug use: Never     Allergies   Hydrochlorothiazide, Lisinopril, and Sulfadiazine   Review of Systems Review of Systems Per HPI  Physical Exam Triage Vital Signs ED Triage Vitals [05/10/21 1139]  Enc Vitals Group     BP (!) 164/80     Pulse Rate 61     Resp 18     Temp 97.9 F (36.6 C)     Temp Source Oral     SpO2 98 %     Weight      Height      Head Circumference      Peak Flow      Pain Score 0     Pain Loc      Pain Edu?      Excl. in [place]?    No data found.  Updated Vital Signs BP (!) 164/80 (BP Location: Left Arm)   Pulse 61   Temp 97.9 F (36.6 C) (Oral)   Resp 18   SpO2 98%   Visual Acuity Right Eye Distance: 20/20 Left Eye Distance: 20/25 Bilateral Distance: 20/13  Right Eye Near:   Left Eye Near:    Bilateral Near:     Physical Exam Constitutional:      General: He is not in acute distress.    Appearance: Normal appearance. He is not toxic-appearing or diaphoretic.  HENT:     Head: Normocephalic and atraumatic.     Right Ear: Tympanic membrane and ear canal normal.     Left Ear: Tympanic membrane and ear canal normal.     Nose: Congestion present.     Mouth/Throat:     Mouth: Mucous membranes are moist.     Pharynx: Posterior oropharyngeal erythema present.  Eyes:     Extraocular Movements: Extraocular movements intact.     Conjunctiva/sclera: Conjunctivae normal.     Pupils: Pupils are equal, round, and reactive to light.  Cardiovascular:     Rate and Rhythm: Normal rate and regular rhythm.     Pulses: Normal pulses.     Heart sounds: Normal heart sounds.  Pulmonary:     Effort: Pulmonary effort is normal. No respiratory  distress.     Breath sounds: Normal breath sounds. No wheezing.  Abdominal:     General: Abdomen is flat. Bowel sounds are normal.     Palpations: Abdomen is soft.  Musculoskeletal:        General: Normal range of motion.     Cervical back: Normal range of motion.  Skin:    General: Skin is warm and dry.  Neurological:     General: No focal  deficit present.     Mental Status: He is alert and oriented to person, place, and time. Mental status is at baseline.  Psychiatric:        Mood and Affect: Mood normal.        Behavior: Behavior normal.     UC Treatments / Results  Labs (all labs ordered are listed, but only abnormal results are displayed) Labs Reviewed  CULTURE, GROUP A STREP ([place])  COVID-19, FLU A+B AND RSV  POCT RAPID STREP A (OFFICE)    EKG   Radiology DG Chest 2 View  Result Date: 05/10/2021 CLINICAL DATA:  Shortness of breath and cough. EXAM: CHEST - 2 VIEW COMPARISON:  None. FINDINGS: Mild cardiomegaly noted. Ectasia of the thoracic aorta also seen. Both lungs are clear. No evidence of pleural effusion. IMPRESSION: Mild cardiomegaly. No active lung disease. Electronically Signed   By: Marlaine Hind M.D.   On: 05/10/2021 13:22    Procedures Procedures (including critical care time)  Medications Ordered in UC Medications - No data to display  Initial Impression / Assessment and Plan / UC Course  I have reviewed the triage vital signs and the nursing notes.  Pertinent labs & imaging results that were available during my care of the patient were reviewed by me and considered in my medical decision making (see chart for details).     Patient presents with symptoms likely from a viral upper respiratory infection. Differential includes bacterial pneumonia, sinusitis, allergic rhinitis, Covid 19, flu, RSV. Do not suspect underlying cardiopulmonary process. Symptoms seem unlikely related to ACS, CHF or COPD exacerbations, pneumonia, pneumothorax. Patient is nontoxic  appearing and not in need of emergent medical intervention.  Rapid strep test was negative.  Throat culture and COVID-19, flu, RSV test are pending.  Chest x-ray did not show any signs of pulmonary process but did show mild cardiomegaly.  Advised patient to follow-up with PCP for further evaluation and management of this.  Recommended symptom control with over the counter medications: Daily oral anti-histamine, Oral decongestant or IN corticosteroid, saline irrigations, cepacol lozenges, honey tea.  Guaifenesin prescribed to take for cough as patient does not see any relief with benzonatate.  Patient to avoid medications that end in D or DM as these may raise blood pressure.  Eyes appear to be improving on physical exam and per patient report.  Patient to continue erythromycin.  No signs of decreased visual acuity on exam.  Visual acuity test normal.  Return if symptoms fail to improve in 1-2 weeks or you develop shortness of breath, chest pain, severe headache. Patient states understanding and is agreeable.  Discharged with PCP followup.  Final Clinical Impressions(s) / UC Diagnoses   Final diagnoses:  Viral upper respiratory tract infection with cough  Eye discomfort, bilateral  Sore throat  Shortness of breath     Discharge Instructions      Your chest x-ray did not show any signs of pneumonia.  You have been prescribed a new cough medication to try.  Please follow-up with primary care for further evaluation management of abnormal heart on chest x-ray.  Follow-up if symptoms persist.     ED Prescriptions     Medication Sig Dispense Auth. Provider   guaiFENesin 200 MG tablet Take 1 tablet (200 mg total) by mouth every 4 (four) hours as needed for cough or to loosen phlegm. 30 suppository [place], Michele Rockers, [place]      PDMP not reviewed this encounter.   Teodora Medici, [place]  05/10/21 1343  

## 2021-05-11 LAB — COVID-19, FLU A+B AND RSV
Influenza A, NAA: NOT DETECTED
Influenza B, NAA: NOT DETECTED
RSV, NAA: NOT DETECTED
SARS-CoV-2, NAA: NOT DETECTED

## 2021-05-13 LAB — CULTURE, GROUP A STREP (THRC)

## 2021-09-21 ENCOUNTER — Other Ambulatory Visit: Payer: Self-pay

## 2021-09-21 ENCOUNTER — Encounter: Payer: Self-pay | Admitting: Emergency Medicine

## 2021-09-21 ENCOUNTER — Encounter (HOSPITAL_COMMUNITY): Payer: Self-pay

## 2021-09-21 ENCOUNTER — Emergency Department (HOSPITAL_COMMUNITY): Payer: Medicare Other

## 2021-09-21 ENCOUNTER — Emergency Department (HOSPITAL_COMMUNITY)
Admission: EM | Admit: 2021-09-21 | Discharge: 2021-09-22 | Disposition: A | Discharge status: Home / Self Care | Payer: Medicare Other | Attending: Emergency Medicine | Admitting: Emergency Medicine

## 2021-09-21 ENCOUNTER — Ambulatory Visit (INDEPENDENT_AMBULATORY_CARE_PROVIDER_SITE_OTHER): Payer: Federal, State, Local not specified - PPO

## 2021-09-21 ENCOUNTER — Ambulatory Visit
Admission: EM | Admit: 2021-09-21 | Discharge: 2021-09-21 | Disposition: A | Discharge status: Other Acute Inpatient Hospital | Payer: Federal, State, Local not specified - PPO | Attending: Internal Medicine | Admitting: Internal Medicine

## 2021-09-21 DIAGNOSIS — R9389 Abnormal findings on diagnostic imaging of other specified body structures: Secondary | ICD-10-CM | POA: Diagnosis not present

## 2021-09-21 DIAGNOSIS — E860 Dehydration: Secondary | ICD-10-CM | POA: Insufficient documentation

## 2021-09-21 DIAGNOSIS — R1084 Generalized abdominal pain: Secondary | ICD-10-CM | POA: Diagnosis not present

## 2021-09-21 DIAGNOSIS — Z20822 Contact with and (suspected) exposure to covid-19: Secondary | ICD-10-CM | POA: Diagnosis not present

## 2021-09-21 DIAGNOSIS — E119 Type 2 diabetes mellitus without complications: Secondary | ICD-10-CM | POA: Insufficient documentation

## 2021-09-21 DIAGNOSIS — Z7984 Long term (current) use of oral hypoglycemic drugs: Secondary | ICD-10-CM | POA: Insufficient documentation

## 2021-09-21 DIAGNOSIS — R059 Cough, unspecified: Secondary | ICD-10-CM | POA: Diagnosis not present

## 2021-09-21 DIAGNOSIS — R197 Diarrhea, unspecified: Secondary | ICD-10-CM

## 2021-09-21 DIAGNOSIS — Z79899 Other long term (current) drug therapy: Secondary | ICD-10-CM | POA: Diagnosis not present

## 2021-09-21 DIAGNOSIS — J181 Lobar pneumonia, unspecified organism: Secondary | ICD-10-CM | POA: Insufficient documentation

## 2021-09-21 DIAGNOSIS — R7981 Abnormal blood-gas level: Secondary | ICD-10-CM

## 2021-09-21 DIAGNOSIS — N179 Acute kidney failure, unspecified: Secondary | ICD-10-CM | POA: Insufficient documentation

## 2021-09-21 DIAGNOSIS — J189 Pneumonia, unspecified organism: Secondary | ICD-10-CM

## 2021-09-21 DIAGNOSIS — I10 Essential (primary) hypertension: Secondary | ICD-10-CM | POA: Diagnosis not present

## 2021-09-21 LAB — CBC WITH DIFFERENTIAL/PLATELET
Abs Immature Granulocytes: 0.01 10*3/uL (ref 0.00–0.07)
Basophils Absolute: 0 10*3/uL (ref 0.0–0.1)
Basophils Relative: 0 %
Eosinophils Absolute: 0 10*3/uL (ref 0.0–0.5)
Eosinophils Relative: 0 %
HCT: 41.5 % (ref 39.0–52.0)
Hemoglobin: 13.6 g/dL (ref 13.0–17.0)
Immature Granulocytes: 0 %
Lymphocytes Relative: 15 %
Lymphs Abs: 0.6 10*3/uL — ABNORMAL LOW (ref 0.7–4.0)
MCH: 27.6 pg (ref 26.0–34.0)
MCHC: 32.8 g/dL (ref 30.0–36.0)
MCV: 84.3 fL (ref 80.0–100.0)
Monocytes Absolute: 0.4 10*3/uL (ref 0.1–1.0)
Monocytes Relative: 11 %
Neutro Abs: 3 10*3/uL (ref 1.7–7.7)
Neutrophils Relative %: 74 %
Platelets: UNDETERMINED 10*3/uL (ref 150–400)
RBC: 4.92 MIL/uL (ref 4.22–5.81)
RDW: 15 % (ref 11.5–15.5)
WBC: 4.1 10*3/uL (ref 4.0–10.5)
nRBC: 0 % (ref 0.0–0.2)

## 2021-09-21 LAB — BASIC METABOLIC PANEL
Anion gap: 8 (ref 5–15)
BUN: 34 mg/dL — ABNORMAL HIGH (ref 8–23)
CO2: 26 mmol/L (ref 22–32)
Calcium: 8.7 mg/dL — ABNORMAL LOW (ref 8.9–10.3)
Chloride: 103 mmol/L (ref 98–111)
Creatinine, Ser: 1.97 mg/dL — ABNORMAL HIGH (ref 0.61–1.24)
GFR, Estimated: 37 mL/min — ABNORMAL LOW (ref >60)
Glucose, Bld: 119 mg/dL — ABNORMAL HIGH (ref 70–99)
Potassium: 3.9 mmol/L (ref 3.5–5.1)
Sodium: 137 mmol/L (ref 135–145)

## 2021-09-21 LAB — URINALYSIS, ROUTINE W REFLEX MICROSCOPIC
Bilirubin Urine: NEGATIVE
Glucose, UA: NEGATIVE mg/dL
Ketones, ur: NEGATIVE mg/dL
Leukocytes,Ua: NEGATIVE
Nitrite: NEGATIVE
Protein, ur: 100 mg/dL — AB
Specific Gravity, Urine: 1.02 (ref 1.005–1.030)
pH: 5 (ref 5.0–8.0)

## 2021-09-21 LAB — HEPATIC FUNCTION PANEL
ALT: 78 U/L — ABNORMAL HIGH (ref 0–44)
AST: 79 U/L — ABNORMAL HIGH (ref 15–41)
Albumin: 3.3 g/dL — ABNORMAL LOW (ref 3.5–5.0)
Alkaline Phosphatase: 78 U/L (ref 38–126)
Bilirubin, Direct: 0.4 mg/dL — ABNORMAL HIGH (ref 0.0–0.2)
Indirect Bilirubin: 1 mg/dL — ABNORMAL HIGH (ref 0.3–0.9)
Total Bilirubin: 1.4 mg/dL — ABNORMAL HIGH (ref 0.3–1.2)
Total Protein: 6.9 g/dL (ref 6.5–8.1)

## 2021-09-21 LAB — RESP PANEL BY RT-PCR (FLU A&B, COVID) ARPGX2
Influenza A by PCR: NEGATIVE
Influenza B by PCR: NEGATIVE
SARS Coronavirus 2 by RT PCR: NEGATIVE

## 2021-09-21 LAB — LACTIC ACID, PLASMA: Lactic Acid, Venous: 1 mmol/L (ref 0.5–1.9)

## 2021-09-21 MED ORDER — METRONIDAZOLE 500 MG PO TABS
500.0000 mg | ORAL_TABLET | Freq: Once | ORAL | Status: AC
  Administered 2021-09-21: 500 mg via ORAL
  Filled 2021-09-21: qty 1

## 2021-09-21 MED ORDER — SODIUM CHLORIDE 0.9 % IV BOLUS
1000.0000 mL | Freq: Once | INTRAVENOUS | Status: AC
  Administered 2021-09-21: 1000 mL via INTRAVENOUS

## 2021-09-21 MED ORDER — ACETAMINOPHEN 325 MG PO TABS
650.0000 mg | ORAL_TABLET | Freq: Once | ORAL | Status: AC
  Administered 2021-09-21: 650 mg via ORAL
  Filled 2021-09-21: qty 2

## 2021-09-21 MED ORDER — METRONIDAZOLE 500 MG PO TABS
500.0000 mg | ORAL_TABLET | Freq: Two times a day (BID) | ORAL | 0 refills | Status: DC
Start: 2021-09-21 — End: 2023-01-18

## 2021-09-21 MED ORDER — IOHEXOL 350 MG/ML SOLN
100.0000 mL | Freq: Once | INTRAVENOUS | Status: AC | PRN
  Administered 2021-09-21: 100 mL via INTRAVENOUS

## 2021-09-21 MED ORDER — AMOXICILLIN-POT CLAVULANATE 875-125 MG PO TABS: 1.0000 | ORAL_TABLET | Freq: Two times a day (BID) | ORAL | 0 refills | Status: DC

## 2021-09-21 MED ORDER — SODIUM CHLORIDE 0.9 % IV SOLN
1.0000 g | Freq: Once | INTRAVENOUS | Status: AC
Start: 2021-09-21 — End: 2021-09-22
  Administered 2021-09-21: 1 g via INTRAVENOUS
  Filled 2021-09-21: qty 10

## 2021-09-21 NOTE — ED Triage Notes (Signed)
Pt arrived via POV, was sent from UC, had a headache, diarrhea x3 days. Had abnormal xray and was sent to ED for CT ?

## 2021-09-21 NOTE — Discharge Instructions (Signed)
Please go to the emergency department as soon as you leave urgent care due to radiologist recommendation of having CT scan to evaluate chest. ?

## 2021-09-21 NOTE — ED Notes (Signed)
Placed recliner in patients room for family members comfort  ?

## 2021-09-21 NOTE — ED Provider Triage Note (Signed)
Emergency Medicine Provider Triage Evaluation Note ? ?Lawrence Macdonald , a 66 y.o. male  was evaluated in triage.  Pt complains of hemoptysis for the last week.  Denies recent URI, fever, or dysphagia.  Sent from [facility] scan due to abnormal CXR suspicious for mass or malignancy.  Hx tobacco use, quit over 20 yrs ago. ? ?Review of Systems  ?Positive: As above ?Negative: As above ? ?Physical Exam  ?BP (!) 154/81 (BP Location: Left Arm)   Pulse 82   Temp (!) 100.7 ?F (38.2 ?C) (Oral)   Resp 20   SpO2 95%  ?Gen:   Awake, no distress   ?Resp:  Normal effort, possible wheeze of right lung ?MSK:   Moves extremities without difficulty  ? ?Medical Decision Making  ?Medically screening exam initiated at 6:26 PM.  Appropriate orders placed.  Lawrence Macdonald was informed that the remainder of the evaluation will be completed by another provider, this initial triage assessment does not replace that evaluation, and the importance of remaining in the ED until their evaluation is complete. ? ?Labs ordered ?  ?Cecil Cobbs, PA-C ?09/21/21 1840 ? ?

## 2021-09-21 NOTE — ED Provider Notes (Signed)
?[facility] ? ? ? ?CSN: [id number] ?Arrival date & time: 09/21/21  1204 ? ? ?  ? ?History   ?Chief Complaint ?Chief Complaint  ?Patient presents with  ? Cough  ? ? ?HPI ?Lawrence Macdonald is a 66 y.o. male.  ? ?Patient presents with cough, headache, diarrhea, painful scalp that has been present for approximately 3 days.  Denies any known fever or sick contacts.  Patient is taking Robitussin for symptoms with minimal improvement.  Patient denies that he has COPD but chart states that he does.  Patient reports that he used to be a smoker.  Cough worsens when lying flat.  Denies any associated chest pain, shortness of breath, sore throat, ear pain, nausea, vomiting, diarrhea, abdominal pain.  Scalp pain occurred yesterday but is not present today.  Patient reports that his scalp was tender to touch yesterday.  Denies any associated lesions or headache.  Denies any changes to the environment that could have caused scalp tenderness. ? ? ?Cough ? ?Past Medical History:  ?Diagnosis Date  ? Diabetes mellitus without complication (HCC)   ? Hypertension   ? ? ?Patient Active Problem List  ? Diagnosis Date Noted  ? Cholelithiasis 08/22/2020  ? Elevated liver enzymes 08/22/2020  ? Hypokalemia 08/22/2020  ? HTN (hypertension) 08/22/2020  ? HLD (hyperlipidemia) 08/22/2020  ? ? ?Past Surgical History:  ?Procedure Laterality Date  ? CHOLECYSTECTOMY N/A 08/23/2020  ? Procedure: LAPAROSCOPIC CHOLECYSTECTOMY WITH INTRAOPERATIVE CHOLANGIOGRAM;  Surgeon: Gaynelle Adu, MD;  Location: [facility];  Service: General;  Laterality: N/A;  ? ERCP N/A 08/24/2020  ? Procedure: ENDOSCOPIC RETROGRADE CHOLANGIOPANCREATOGRAPHY (ERCP);  Surgeon: Vida Rigger, MD;  Location: [facility];  Service: Endoscopy;  Laterality: N/A;  ? REMOVAL OF STONES  08/24/2020  ? Procedure: REMOVAL OF STONES;  Surgeon: Vida Rigger, MD;  Location: [facility];  Service: Endoscopy;;  ? SPHINCTEROTOMY  08/24/2020  ? Procedure: SPHINCTEROTOMY;  Surgeon: Vida Rigger, MD;   Location: [facility];  Service: Endoscopy;;  ? TONSILLECTOMY    ? ? ? ? ? ?Home Medications   ? ?Prior to Admission medications   ?Medication Sig Start Date End Date Taking? Authorizing Provider  ?acetaminophen (TYLENOL) 500 MG tablet You can take 1000 mg of Tylenol/acetaminophen every 6 hours as needed for pain.  Do not exceed 4000 mg of Tylenol per day it can harm your liver.  You can buy this over-the-counter at any drugstore.  You can use naproxen in addition to plain Tylenol for pain. 08/25/20  Yes Sherrie George, PA-C  ?albuterol (VENTOLIN HFA) 108 (90 Base) MCG/ACT inhaler  05/28/16  Yes [provider]  ?atorvastatin (LIPITOR) 40 MG tablet Take 40 mg by mouth daily.   Yes [provider]  ?benzonatate (TESSALON) 100 MG capsule Take 1 capsule (100 mg total) by mouth 3 (three) times daily as needed for cough. 05/07/21  Yes Lamptey, Britta Mccreedy, MD  ?docusate sodium (COLACE) 100 MG capsule Take 1 capsule (100 mg total) by mouth 2 (two) times daily. 08/25/20  Yes Shahmehdi, Gemma Payor, MD  ?guaiFENesin 200 MG tablet Take 1 tablet (200 mg total) by mouth every 4 (four) hours as needed for cough or to loosen phlegm. 05/10/21  Yes Gustavus Bryant, FNP  ?hydrALAZINE (APRESOLINE) 50 MG tablet Take 50 mg by mouth 2 (two) times daily. 06/26/20  Yes [provider]  ?metFORMIN (GLUCOPHAGE) 500 MG tablet Take 500 mg by mouth daily. 02/13/20  Yes [provider]  ?naproxen sodium (ALEVE) 220 MG tablet  You can take 1-2 tablets every 12 hours as needed for pain.  You can use this in addition to the plain Tylenol/acetaminophen.  You can buy this over-the-counter at any drugstore. 08/25/20  Yes Sherrie GeorgeJennings, Willard, PA-C  ?valsartan (DIOVAN) 320 MG tablet Take 320 mg by mouth daily. 11/25/19  Yes [provider]  ?amLODipine (NORVASC) 10 MG tablet Take 10 mg by mouth daily. 06/26/19 08/22/20  [provider]  ? ? ?Family History ?History reviewed. No pertinent family history. ? ?Social  History ?Social History  ? ?Tobacco Use  ? Smoking status: Never  ? Smokeless tobacco: Never  ?Substance Use Topics  ? Alcohol use: Yes  ? Drug use: Never  ? ? ? ?Allergies   ?Hydrochlorothiazide, Lisinopril, and Sulfadiazine ? ? ?Review of Systems ?Review of Systems ?Per HPI ? ?Physical Exam ?Triage Vital Signs ?ED Triage Vitals  ?Enc Vitals Group  ?   BP 09/21/21 1303 138/77  ?   Pulse Rate 09/21/21 1303 71  ?   Resp 09/21/21 1303 18  ?   Temp 09/21/21 1303 98.9 ?F (37.2 ?C)  ?   Temp Source 09/21/21 1303 Oral  ?   SpO2 09/21/21 1303 92 %  ?   Weight --   ?   Height --   ?   Head Circumference --   ?   Peak Flow --   ?   Pain Score 09/21/21 1305 1  ?   Pain Loc --   ?   Pain Edu? --   ?   Excl. in GC? --   ? ?No data found. ? ?Updated Vital Signs ?BP 138/77 (BP Location: Left Arm)   Pulse 71   Temp 98.9 ?F (37.2 ?C) (Oral)   Resp 18   SpO2 92%  ? ?Visual Acuity ?Right Eye Distance:   ?Left Eye Distance:   ?Bilateral Distance:   ? ?Right Eye Near:   ?Left Eye Near:    ?Bilateral Near:    ? ?Physical Exam ?Constitutional:   ?   General: He is not in acute distress. ?   Appearance: Normal appearance. He is not toxic-appearing or diaphoretic.  ?HENT:  ?   Head: Normocephalic and atraumatic.  ?   Right Ear: Tympanic membrane and ear canal normal.  ?   Left Ear: Tympanic membrane and ear canal normal.  ?   Nose: Congestion present.  ?   Mouth/Throat:  ?   Mouth: Mucous membranes are moist.  ?   Pharynx: No posterior oropharyngeal erythema.  ?Eyes:  ?   Extraocular Movements: Extraocular movements intact.  ?   Conjunctiva/sclera: Conjunctivae normal.  ?   Pupils: Pupils are equal, round, and reactive to light.  ?Cardiovascular:  ?   Rate and Rhythm: Normal rate and regular rhythm.  ?   Pulses: Normal pulses.  ?   Heart sounds: Normal heart sounds.  ?Pulmonary:  ?   Effort: Pulmonary effort is normal. No respiratory distress.  ?   Breath sounds: No stridor. Rhonchi present. No wheezing.  ?   Comments: Rhonchi to right  side. ?Abdominal:  ?   General: Bowel sounds are normal. There is no distension.  ?   Palpations: Abdomen is soft.  ?   Tenderness: There is no abdominal tenderness.  ?Musculoskeletal:     ?   General: Normal range of motion.  ?   Cervical back: Normal range of motion.  ?Skin: ?   General: Skin is warm and dry.  ?  Comments: No obvious abnormalities to scalp.  No lesions or discoloration.  No erythema.  ?Neurological:  ?   General: No focal deficit present.  ?   Mental Status: He is alert and oriented to person, place, and time. Mental status is at baseline.  ?Psychiatric:     ?   Mood and Affect: Mood normal.     ?   Behavior: Behavior normal.  ? ? ? ?UC Treatments / Results  ?Labs ?(all labs ordered are listed, but only abnormal results are displayed) ?Labs Reviewed - No data to display ? ?EKG ? ? ?Radiology ?DG Chest 2 View ? ?Result Date: 09/21/2021 ?CLINICAL DATA:  Cough. EXAM: CHEST - 2 VIEW COMPARISON:  May 10, 2021. FINDINGS: Stable cardiomegaly. Left lung is clear. Lobular opacity is seen in right infrahilar region concerning for possible neoplasm or malignancy. Bony thorax is unremarkable. IMPRESSION: Lobular opacity is seen in right infrahilar region concerning for possible neoplasm or malignancy. CT scan of the chest with intravenous contrast is recommended for further evaluation. Electronically Signed   By: Lupita Raider M.D.   On: 09/21/2021 13:50   ? ?Procedures ?Procedures (including critical care time) ? ?Medications Ordered in UC ?Medications - No data to display ? ?Initial Impression / Assessment and Plan / UC Course  ?I have reviewed the triage vital signs and the nursing notes. ? ?Pertinent labs & imaging results that were available during my care of the patient were reviewed by me and considered in my medical decision making (see chart for details). ? ?  ? ?Patient was originally advised to go to the hospital for further evaluation and management given oxygen saturation was ranging from  89 to 93% during initial triage and physical exam.  Patient refused to go to the hospital due to this.  He was agreeable to having chest x-ray completed at urgent care.  Chest x-ray showing lobular opaci

## 2021-09-21 NOTE — ED Provider Notes (Addendum)
[facility]-EMERGENCY DEPT Provider Note   CSN: [id number] Arrival date & time: 09/21/21  1737     History  No chief complaint on file.   Lawrence Macdonald is a 66 y.o. male history of hypertension, diabetes, here presenting with chills and diarrhea and cough.  Patient has been having diarrhea for the last week or so.  He states that he has been having 6-7 episodes of loose stools per day.  He states that he also has been coughing and has subjective chills.  He went to urgent care to get a COVID test but they are unable to give a COVID test and he had a chest x-ray that showed possible mass so he was sent here for further evaluation.  Patient states that he has a productive cough but denies any blood in his sputum.   The history is provided by the patient.      Home Medications Prior to Admission medications   Medication Sig Start Date End Date Taking? Authorizing Provider  acetaminophen (TYLENOL) 500 MG tablet You can take 1000 mg of Tylenol/acetaminophen every 6 hours as needed for pain.  Do not exceed 4000 mg of Tylenol per day it can harm your liver.  You can buy this over-the-counter at any drugstore.  You can use naproxen in addition to plain Tylenol for pain. 08/25/20   Sherrie George, PA-C  albuterol (VENTOLIN HFA) 108 [phone] Base) MCG/ACT inhaler  05/28/16   [provider]  amLODipine (NORVASC) 10 MG tablet Take 10 mg by mouth daily. 06/26/19 08/22/20  [provider]  atorvastatin (LIPITOR) 40 MG tablet Take 40 mg by mouth daily.    [provider]  benzonatate (TESSALON) 100 MG capsule Take 1 capsule (100 mg total) by mouth 3 (three) times daily as needed for cough. 05/07/21   Lamptey, Britta Mccreedy, MD  docusate sodium (COLACE) 100 MG capsule Take 1 capsule (100 mg total) by mouth 2 (two) times daily. 08/25/20   Shahmehdi, Gemma Payor, MD  guaiFENesin 200 MG tablet Take 1 tablet (200 mg total) by mouth every 4 (four) hours as needed for cough  or to loosen phlegm. 05/10/21   Gustavus Bryant, FNP  hydrALAZINE (APRESOLINE) 50 MG tablet Take 50 mg by mouth 2 (two) times daily. 06/26/20   [provider]  metFORMIN (GLUCOPHAGE) 500 MG tablet Take 500 mg by mouth daily. 02/13/20   [provider]  naproxen sodium (ALEVE) 220 MG tablet You can take 1-2 tablets every 12 hours as needed for pain.  You can use this in addition to the plain Tylenol/acetaminophen.  You can buy this over-the-counter at any drugstore. 08/25/20   Sherrie George, PA-C  valsartan (DIOVAN) 320 MG tablet Take 320 mg by mouth daily. 11/25/19   [provider]      Allergies    Hydrochlorothiazide, Lisinopril, and Sulfadiazine    Review of Systems   Review of Systems  Respiratory:  Positive for cough.   Gastrointestinal:  Positive for abdominal pain and diarrhea.  All other systems reviewed and are negative.  Physical Exam Updated Vital Signs BP (!) 155/95   Pulse (!) 50   Temp (!) 100.7 F (38.2 C) (Oral)   Resp 20   SpO2 93%  Physical Exam Vitals and nursing note reviewed.  Constitutional:      Comments: Dehydrated  HENT:     Head: Normocephalic.     Nose: Nose normal.     Mouth/Throat:  Mouth: Mucous membranes are dry.  Eyes:     Extraocular Movements: Extraocular movements intact.     Pupils: Pupils are equal, round, and reactive to light.  Cardiovascular:     Rate and Rhythm: Normal rate and regular rhythm.     Pulses: Normal pulses.     Heart sounds: Normal heart sounds.  Pulmonary:     Effort: Pulmonary effort is normal.     Breath sounds: Normal breath sounds.  Abdominal:     General: Abdomen is flat.     Comments: Mild diffuse tenderness  Musculoskeletal:        General: Normal range of motion.     Cervical back: Normal range of motion and neck supple.  Skin:    General: Skin is warm.     Capillary Refill: Capillary refill takes less than 2 seconds.  Neurological:     General: No focal deficit present.      Mental Status: He is oriented to person, place, and time.  Psychiatric:        Mood and Affect: Mood normal.        Behavior: Behavior normal.    ED Results / Procedures / Treatments   Labs (all labs ordered are listed, but only abnormal results are displayed) Labs Reviewed  BASIC METABOLIC PANEL - Abnormal; Notable for the following components:      Result Value   Glucose, Bld 119 (*)    BUN 34 (*)    Creatinine, Ser 1.97 (*)    Calcium 8.7 (*)    GFR, Estimated 37 (*)    All other components within normal limits  CBC WITH DIFFERENTIAL/PLATELET - Abnormal; Notable for the following components:   Lymphs Abs 0.6 (*)    All other components within normal limits  HEPATIC FUNCTION PANEL - Abnormal; Notable for the following components:   Albumin 3.3 (*)    AST 79 (*)    ALT 78 (*)    Total Bilirubin 1.4 (*)    Bilirubin, Direct 0.4 (*)    Indirect Bilirubin 1.0 (*)    All other components within normal limits  CULTURE, BLOOD (ROUTINE X 2)  CULTURE, BLOOD (ROUTINE X 2)  RESP PANEL BY RT-PCR (FLU A&B, COVID) ARPGX2  LACTIC ACID, PLASMA  LACTIC ACID, PLASMA  URINALYSIS, ROUTINE W REFLEX MICROSCOPIC    EKG None  Radiology DG Chest 2 View  Result Date: 09/21/2021 CLINICAL DATA:  Cough. EXAM: CHEST - 2 VIEW COMPARISON:  May 10, 2021. FINDINGS: Stable cardiomegaly. Left lung is clear. Lobular opacity is seen in right infrahilar region concerning for possible neoplasm or malignancy. Bony thorax is unremarkable. IMPRESSION: Lobular opacity is seen in right infrahilar region concerning for possible neoplasm or malignancy. CT scan of the chest with intravenous contrast is recommended for further evaluation. Electronically Signed   By: Lupita Raider M.D.   On: 09/21/2021 13:50    Procedures Procedures   Angiocath insertion Performed by: Richardean Canal  Consent: Verbal consent obtained. Risks and benefits: risks, benefits and alternatives were discussed Time out:  Immediately prior to procedure a "time out" was called to verify the correct patient, procedure, equipment, support staff and site/side marked as required.  Preparation: Patient was prepped and draped in the usual sterile fashion.  Vein Location: R antecube  Ultrasound Guided  Gauge: 20 long   Normal blood return and flush without difficulty Patient tolerance: Patient tolerated the procedure well with no immediate complications.    Medications Ordered in  ED Medications  sodium chloride 0.9 % bolus 1,000 mL (1,000 mLs Intravenous New Bag/Given 09/21/21 2049)  acetaminophen (TYLENOL) tablet 650 mg (650 mg Oral Given 09/21/21 2015)    ED Course/ Medical Decision Making/ A&P                           Medical Decision Making Lawrence Macdonald is a 66 y.o. male here presenting with cough and diarrhea.  Patient has been having diarrhea for about a week or so.  Patient also has cough as well.  Urgent care chest x-ray showed possible mass.  I wonder if he has a PE versus pneumonia versus colitis versus gastroenteritis.  Plan to get CT angio chest and CT abdomen pelvis.  Will hydrate patient and reassess   11:46 PM Patient has AKI with creatinine of 1.9.  Patient's white blood cell count is normal and lactate is normal.  CT chest abdomen pelvis showed right lower lobe pneumonia with no obvious mass or metastasis.  Patient has a left inguinal hernia that is reducible.  Patient is able to tolerate p.o. patient does not appear septic.  I considered admission versus close follow-up and patient would like to go home.  We will give Rocephin and will discharge home with Augmentin to cover pneumonia.  Given he has some diarrhea and he is getting antibiotics for pneumonia, he is at risk for C. difficile so we will give Flagyl as well.  Patient has no obvious colitis on CT.  Told him to call his PCP tomorrow for close follow-up. He will need repeat kidney function in a week.   Problems Addressed: AKI (acute  kidney injury) (HCC): acute illness or injury Community acquired pneumonia of right lower lobe of lung: acute illness or injury Diarrhea, unspecified type: acute illness or injury  Amount and/or Complexity of Data Reviewed Labs: ordered. Decision-making details documented in ED Course. Radiology: ordered and independent interpretation performed. Decision-making details documented in ED Course.  Risk OTC drugs. Prescription drug management.  Final Clinical Impression(s) / ED Diagnoses Final diagnoses:  None    Rx / DC Orders ED Discharge Orders     None         Charlynne Pander, MD 09/21/21 2349    Charlynne Pander, MD 09/21/21 2350

## 2021-09-21 NOTE — Discharge Instructions (Addendum)
You have pneumonia so please take Augmentin twice daily for a week ? ?I also prescribed Flagyl for your diarrhea. ? ?Stay hydrated ? ?Your kidney function is abnormal and needs to be rechecked with your doctor this week ? ?Please call your doctor tomorrow for appointment this week ? ?Stay hydrated. ? ?Return to ER if you have worse cough, fever, abdominal pain, vomiting ?

## 2021-09-21 NOTE — ED Triage Notes (Signed)
Patient c/o productive cough, headache, diarrhea, sore scalp x 3 days.  Patient has taken Robitussin.  Cough gets worse with lying down. ?

## 2021-09-22 ENCOUNTER — Telehealth (HOSPITAL_BASED_OUTPATIENT_CLINIC_OR_DEPARTMENT_OTHER): Payer: Self-pay | Admitting: Emergency Medicine

## 2021-09-22 LAB — BLOOD CULTURE ID PANEL (REFLEXED) - BCID2

## 2021-09-22 NOTE — ED Provider Notes (Signed)
Patient with 1 out of 4 bottles with staph species growing.  Likely contamination.  There is no further speciation other than this is a staph species.  Per chart review patient did not have leukocytosis or lactic acidosis. ?  Lawrence Norfolk, DO ?09/22/21 2018 ? ?

## 2021-09-22 NOTE — Telephone Encounter (Signed)
Received call from micro lab with positive blood culture. Consulted with Dr. Lockie Mola who advised likely contaminate. No further action at this time.  ?

## 2021-09-24 LAB — CULTURE, BLOOD (ROUTINE X 2): Special Requests: ADEQUATE

## 2021-09-26 LAB — CULTURE, BLOOD (ROUTINE X 2)
Culture: NO GROWTH
Special Requests: ADEQUATE

## 2022-04-15 IMAGING — US US ABDOMEN LIMITED RUQ/ASCITES
1 series · 14 of 25 positions shown · non-contrast
Comparison: None.

CLINICAL DATA: Right upper quadrant and epigastric pain.

EXAM:
ULTRASOUND ABDOMEN LIMITED RIGHT UPPER QUADRANT

[Series 1: us abdomen limited ruq/ascites · 14 of 69 slices shown]
[im 1/69]
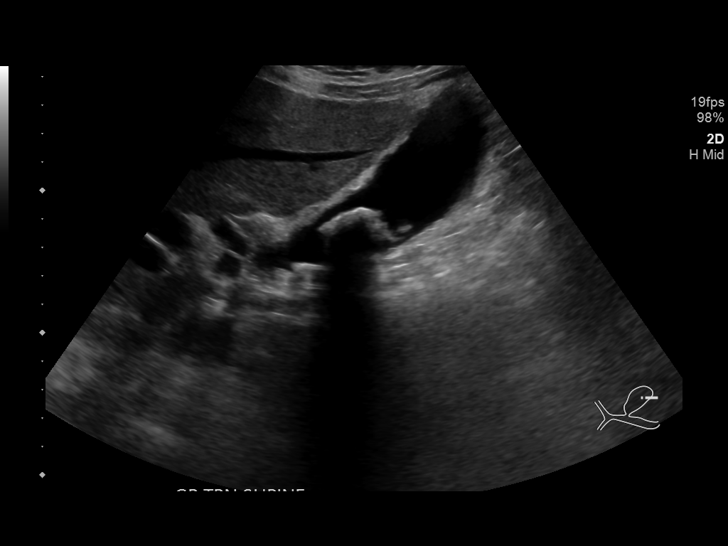
[im 6/69]
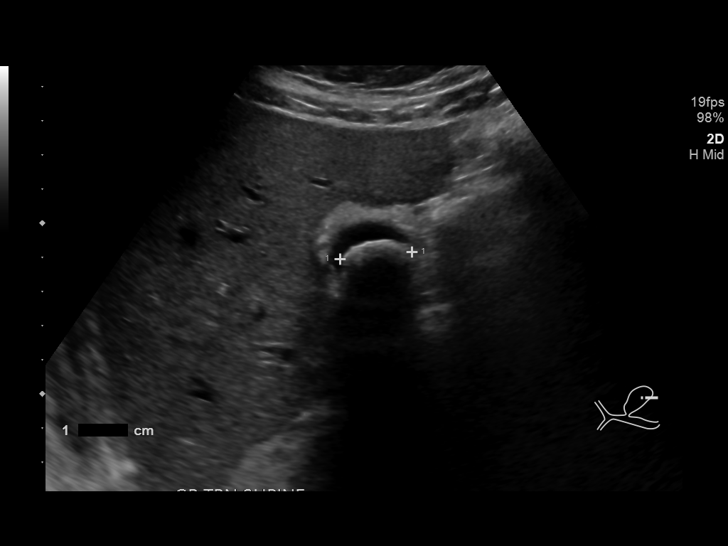
[im 12/69]
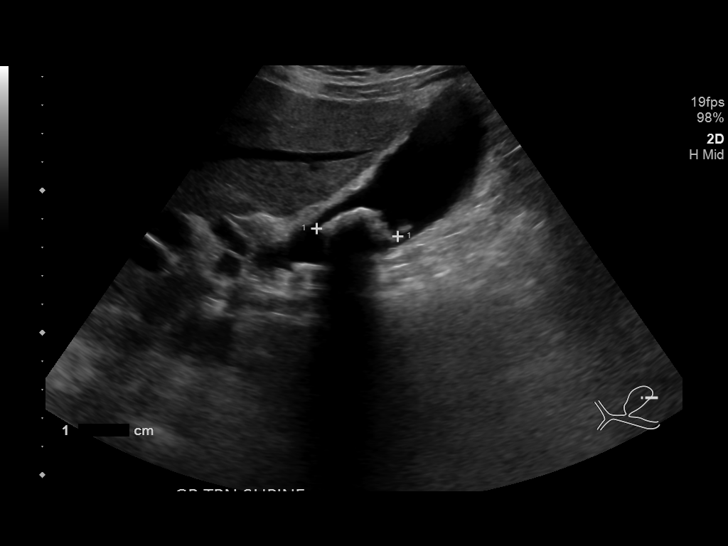
[im 18/69]
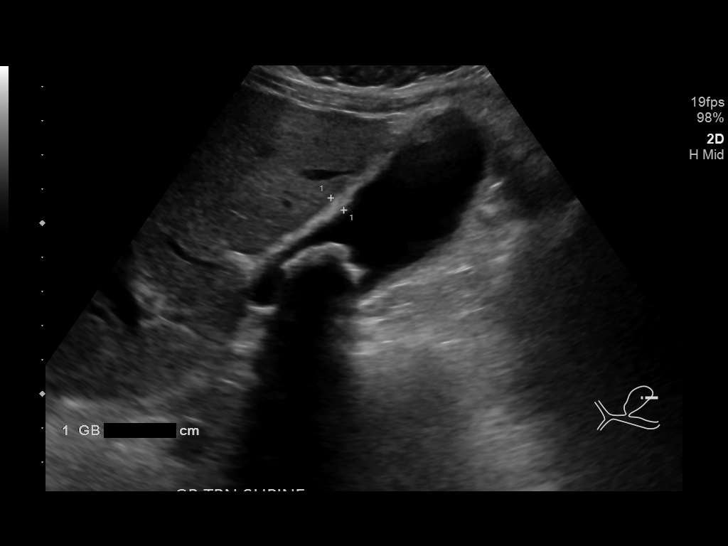
[im 23/69]
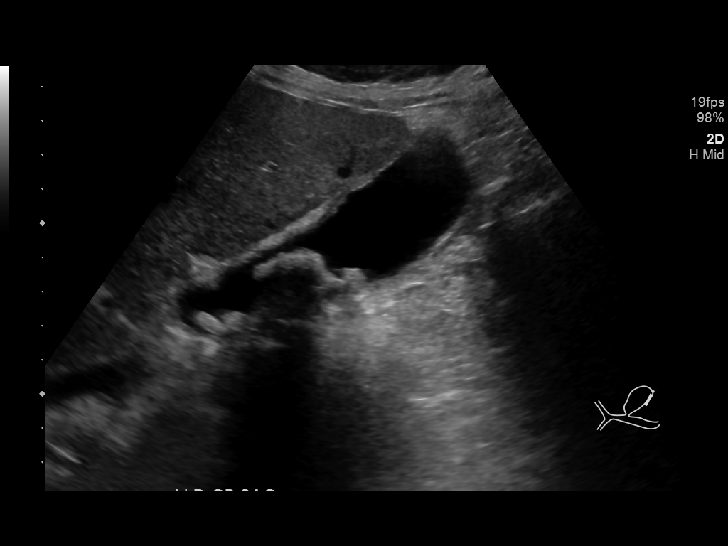
[im 26/69]
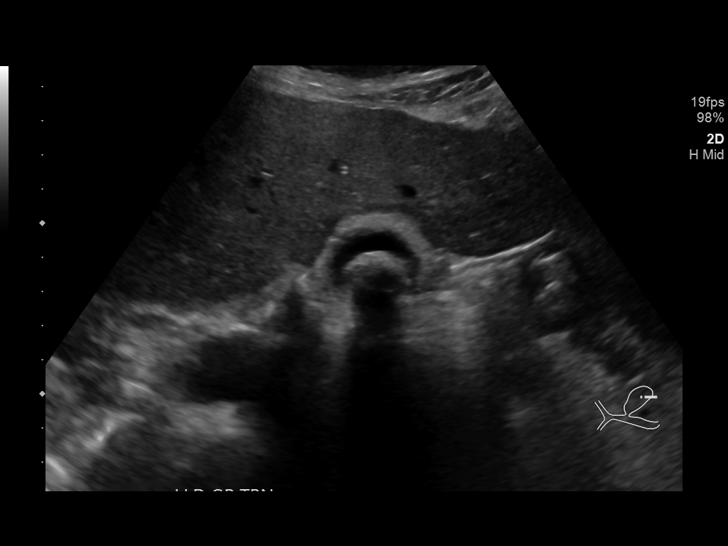
[im 32/69]
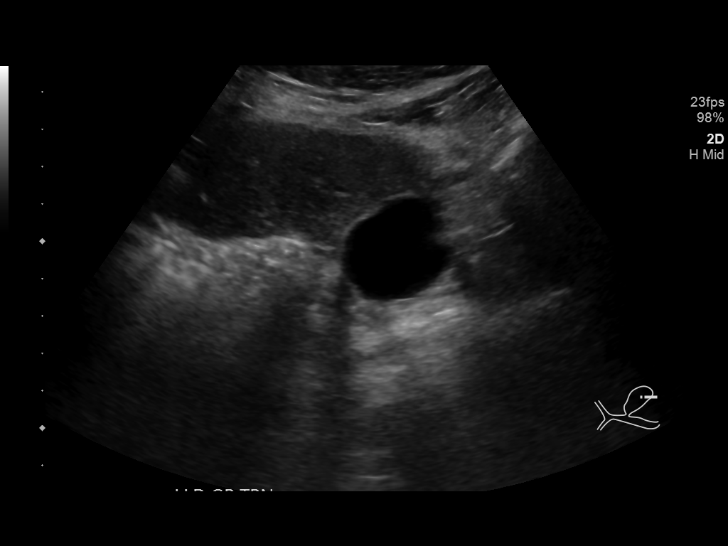
[im 37/69]
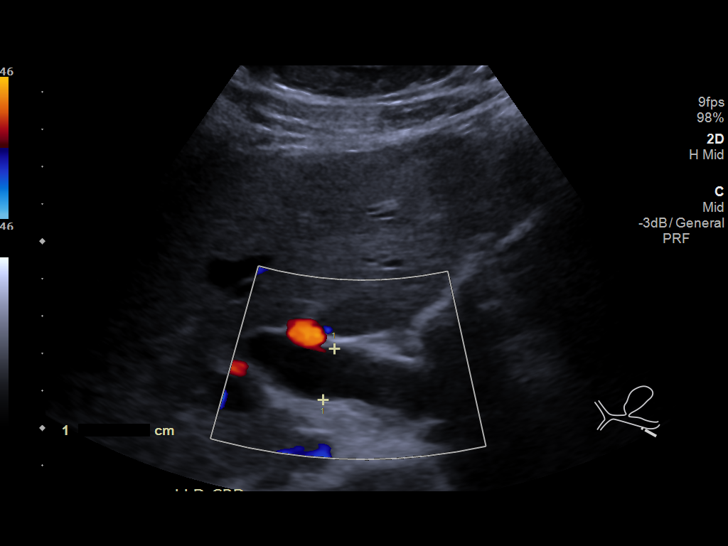
[im 43/69]
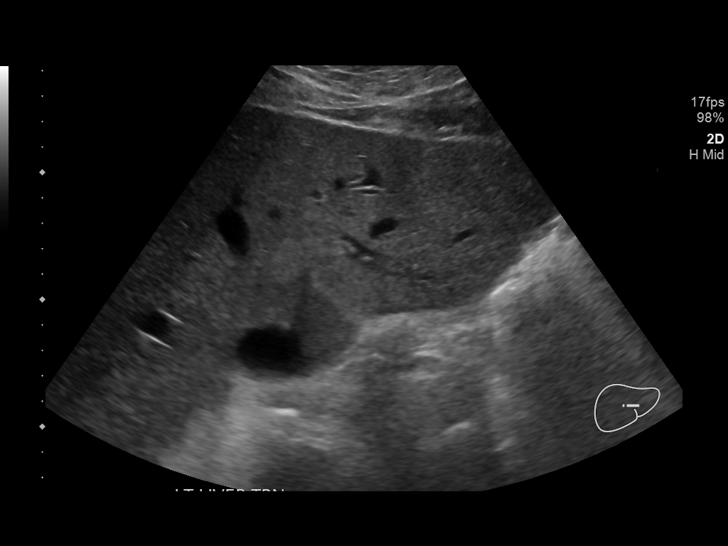
[im 46/69]
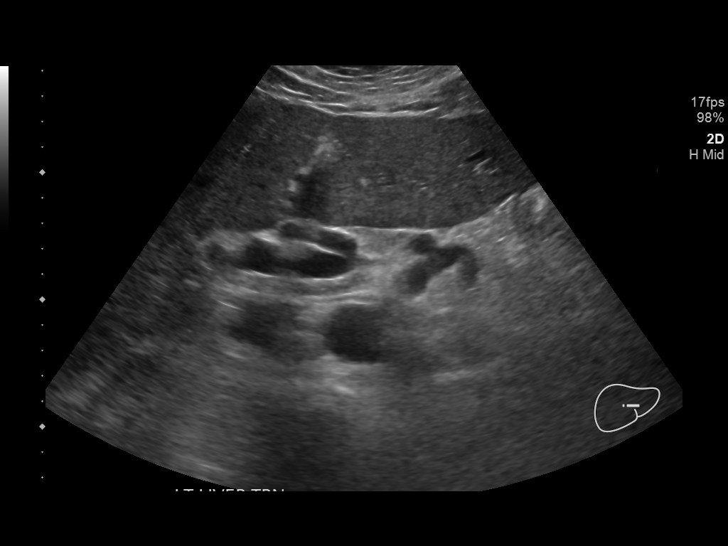
[im 52/69]
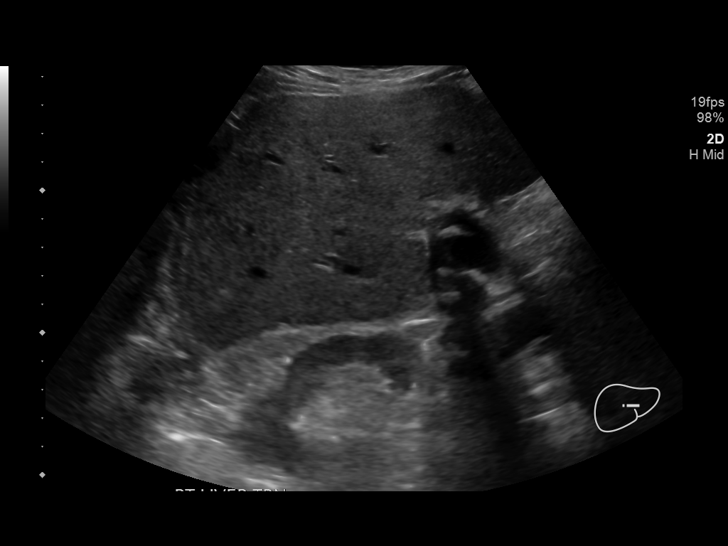
[im 57/69]
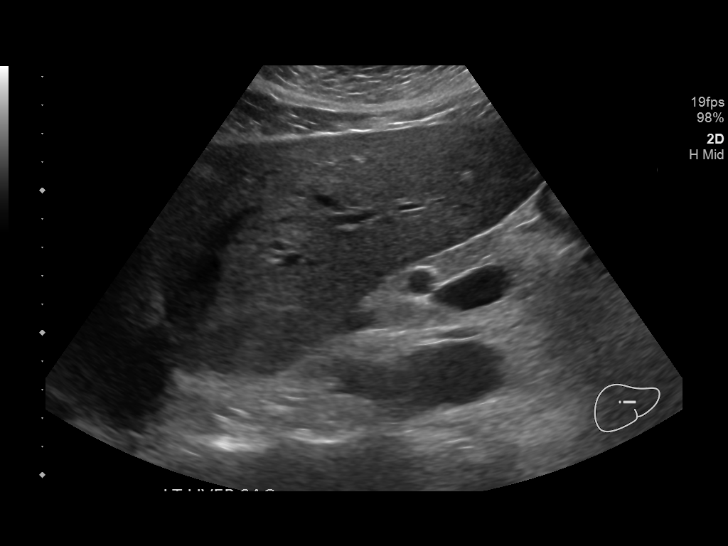
[im 63/69]
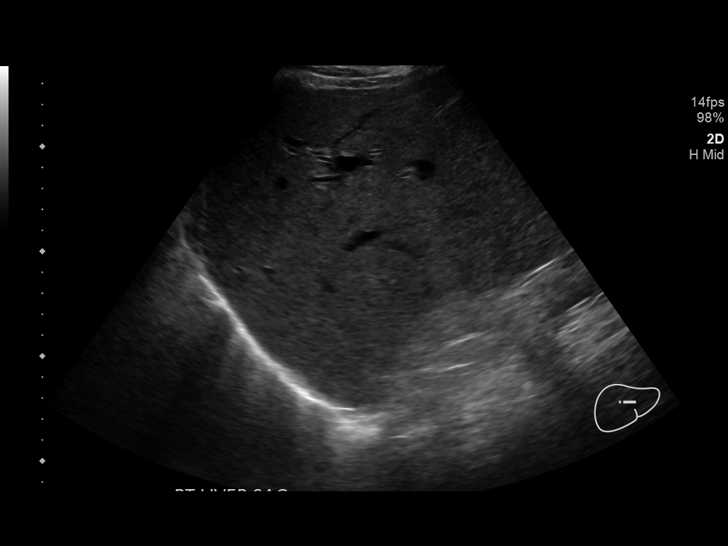
[im 69/69]
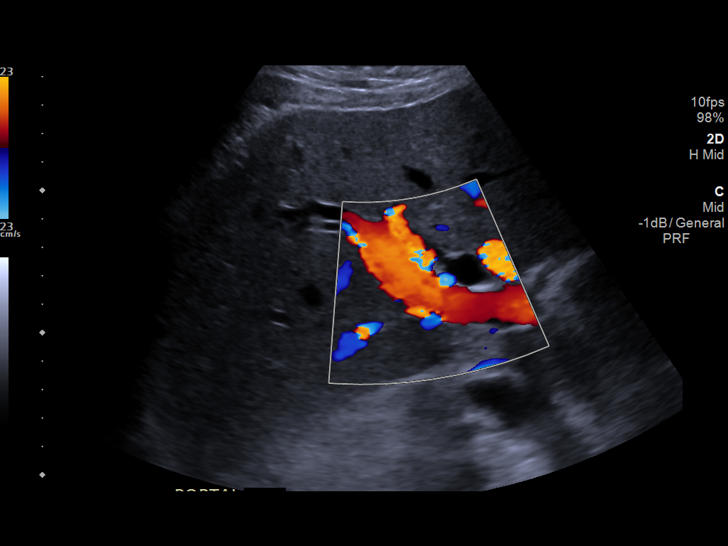

[14 of 25 positions shown; findings below may reference images not displayed]

FINDINGS: Gallbladder:

Physiologic lead distended. Gallstones including a large 2.9 cm
stone. There is mild wall thickening at 5 mm. No pericholecystic
fluid. No sonographic Murphy sign noted by sonographer.

Common bile duct:

Diameter: 13 mm proximally, 14 mm distally. No visualized
choledocholithiasis.

Liver:

No focal lesion identified. Within normal limits in parenchymal
echogenicity. Portal vein is patent on color Doppler imaging with
normal direction of blood flow towards the liver.

Other: No upper quadrant ascites. Incidental note of prominent
column of Bertin in the right kidney.
IMPRESSION: 1. Gallstones with mild gallbladder wall thickening. No sonographic
Murphy sign. Findings may represent acute cholecystitis in the
appropriate clinical setting.
2. Dilated common bile duct at 14 mm. No visualized
choledocholithiasis. MRCP could be considered for biliary tree
evaluation if patient is able to tolerate breath hold technique.

## 2022-04-16 IMAGING — RF DG CHOLANGIOGRAM OPERATIVE
1 series · 8 of 8 positions shown · non-contrast
Comparison: MRI abdomen from 08/22/2020

CLINICAL DATA: 64-year-old male with history of cholelithiasis.

EXAM:
INTRAOPERATIVE CHOLANGIOGRAM
TECHNIQUE: Cholangiographic images from the C-arm fluoroscopic device were
submitted for interpretation post-operatively. Please see the
procedural report for the amount of contrast and the fluoroscopy
time utilized.

[Series 1: run · 2 acquisitions, 8 frames shown]
[im 1/2]
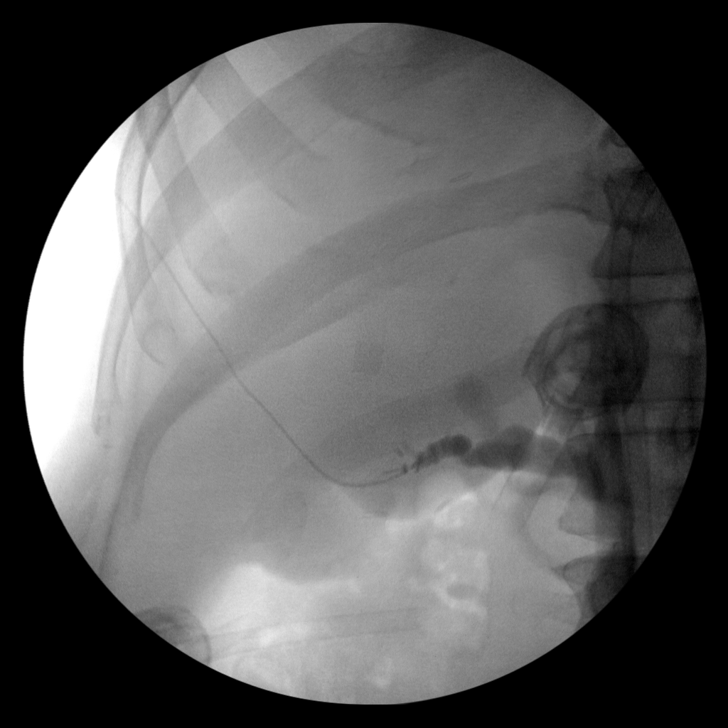
[im 1/2]
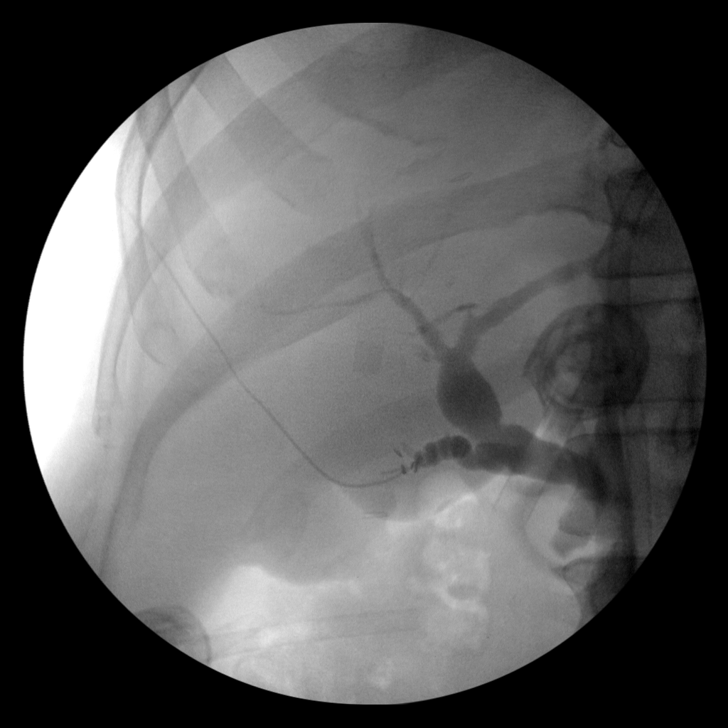
[im 1/2]
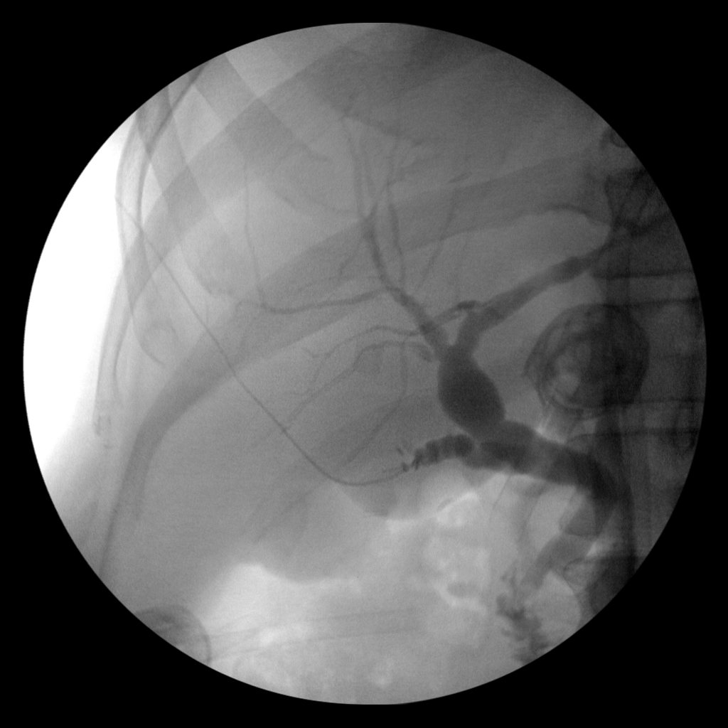
[im 1/2]
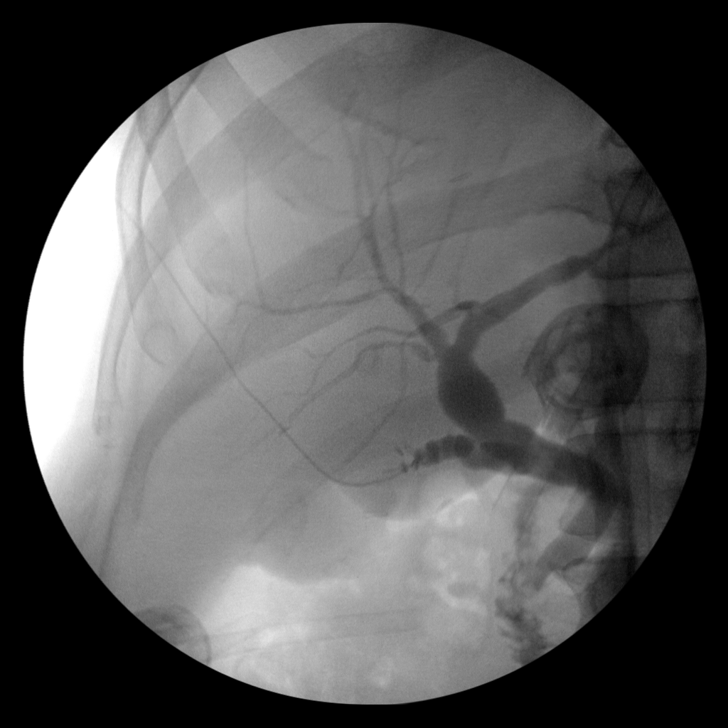
[im 2/2]
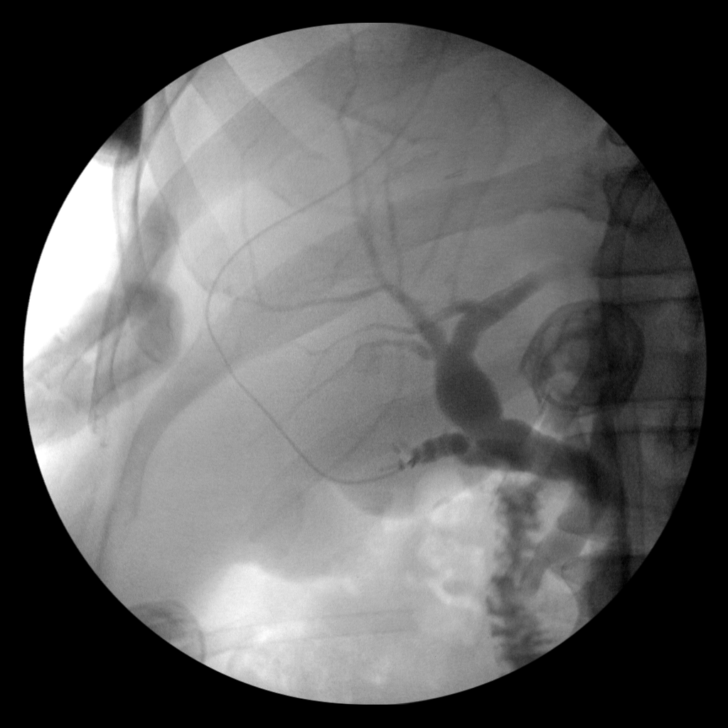
[im 2/2]
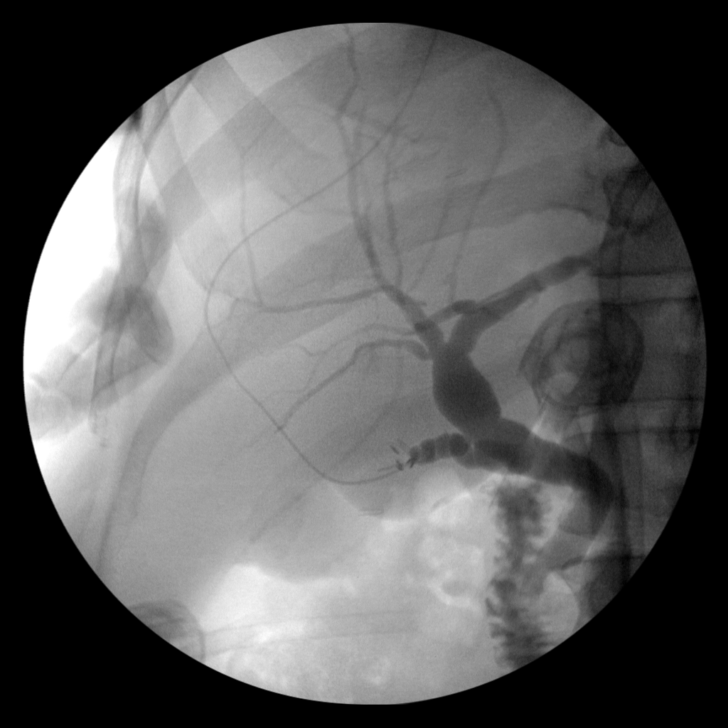
[im 2/2]
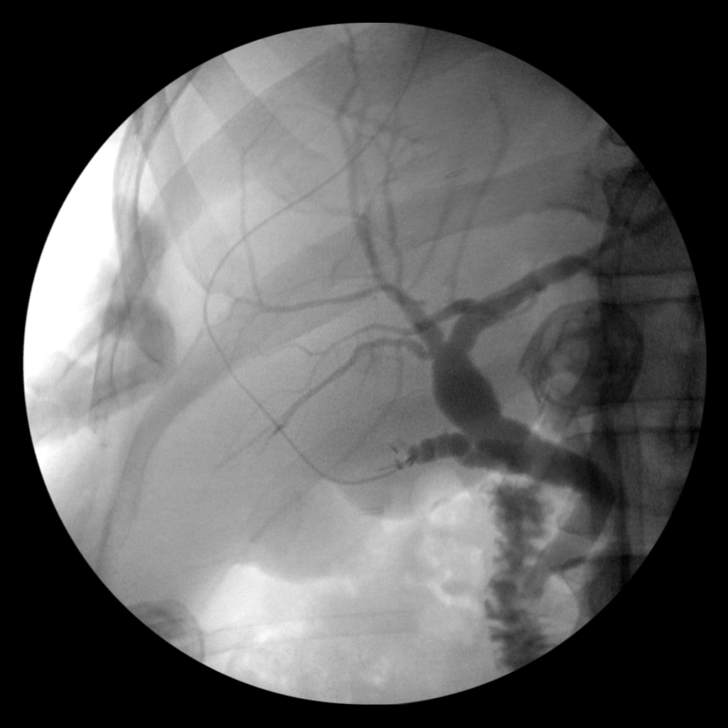
[im 2/2]
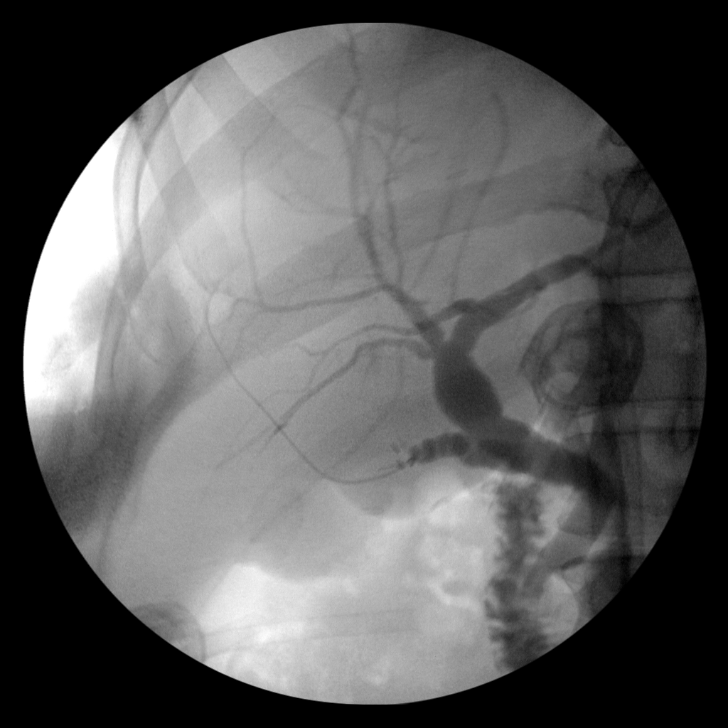

[8 of 8 positions shown; findings below may reference images not displayed]

FINDINGS: Intraoperative antegrade injection via the cystic duct which
opacifies the common bile duct and central portions of the
intrahepatic biliary tree. Contrast is visualized flowing freely
into the duodenum. There are no filling defects. Mild superior
common bile duct dilation at the level of the confluence. No
significant intrahepatic biliary ductal dilation. No apparent
anomalous anatomical configuration of the biliary tree.
IMPRESSION: No evidence of choledocholithiasis.

## 2022-11-19 ENCOUNTER — Other Ambulatory Visit (HOSPITAL_COMMUNITY): Payer: Self-pay | Admitting: Urology

## 2022-11-19 DIAGNOSIS — C61 Malignant neoplasm of prostate: Secondary | ICD-10-CM

## 2022-12-06 ENCOUNTER — Encounter (HOSPITAL_COMMUNITY)
Admission: RE | Admit: 2022-12-06 | Discharge: 2022-12-06 | Disposition: A | Discharge status: Home / Self Care | Payer: Medicare Other | Source: Ambulatory Visit | Attending: Urology | Admitting: Urology

## 2022-12-06 DIAGNOSIS — C61 Malignant neoplasm of prostate: Secondary | ICD-10-CM | POA: Diagnosis present

## 2022-12-06 MED ORDER — PIFLIFOLASTAT F 18 (PYLARIFY) INJECTION
9.0000 | Freq: Once | INTRAVENOUS | Status: AC
  Administered 2022-12-06: 10.11 via INTRAVENOUS

## 2022-12-23 ENCOUNTER — Telehealth: Payer: Self-pay

## 2022-12-23 NOTE — Telephone Encounter (Signed)
Patient doesn't have voicemail set up, will mail packet and call closer to appointment date

## 2022-12-23 NOTE — Telephone Encounter (Signed)
I called pt to introduce myself as the Coordinator of the [facility].   1. I confirmed with the patient he is aware of his referral to the clinic 7/9, arriving @ 12:30 pm.    2. I discussed the format of the clinic and the physicians he will be seeing that day.   3. I discussed where the clinic is located and how to contact me.   4. I confirmed his address and informed him I would be mailing a packet of information and forms to be completed. I asked him to bring them with him the day of his appointment.    He voiced understanding of the above. I asked him to call me if he has any questions or concerns regarding his appointments or the forms he needs to complete.

## 2023-01-10 NOTE — Progress Notes (Signed)
RN spoke with patient to confirm upcoming appointment for the [facility] on 7/9 @ 12:30pm.

## 2023-01-10 NOTE — Progress Notes (Unsigned)
                               Care Plan Summary  Name: Lawrence Macdonald DOB: 1956/03/20   Your Medical Team:   Urologist -  Dr. Heloise Purpura, [facility]  Radiation Oncologist - Dr. Margaretmary Dys, [facility]     Recommendations: 1) Stop medication, Orgovyx.  2) Surgery    * These recommendations are based on information available as of today's consult.      Recommendations may change depending on the results of further tests or exams.    Next Steps: 1) [facility] will contact you to set up physical therapy, and surgical date.     When appointments need to be scheduled, you will be contacted by [facility] and/or [facility].  Questions?  Please do not hesitate to call Cherlyn Cushing, BSN, RN at [phone] with any questions or concerns.  [name] is your Oncology Nurse Navigator and is available to assist you while you're receiving your medical care at [facility].

## 2023-01-11 ENCOUNTER — Other Ambulatory Visit: Payer: Self-pay | Admitting: Genetic Counselor

## 2023-01-11 ENCOUNTER — Encounter: Payer: Self-pay | Admitting: Radiation Oncology

## 2023-01-11 ENCOUNTER — Inpatient Hospital Stay: Payer: Medicare Other | Attending: Radiation Oncology | Admitting: Genetic Counselor

## 2023-01-11 ENCOUNTER — Ambulatory Visit
Admission: RE | Admit: 2023-01-11 | Discharge: 2023-01-11 | Disposition: A | Discharge status: Home / Self Care | Payer: Medicare Other | Source: Ambulatory Visit | Attending: Radiation Oncology | Admitting: Radiation Oncology

## 2023-01-11 ENCOUNTER — Inpatient Hospital Stay: Payer: Medicare Other

## 2023-01-11 ENCOUNTER — Encounter: Payer: Self-pay | Admitting: Urology

## 2023-01-11 ENCOUNTER — Encounter: Payer: Self-pay | Admitting: Genetic Counselor

## 2023-01-11 VITALS — BP 131/87 | HR 66 | Temp 97.3°F | Resp 18 | Ht 70.0 in | Wt 241.6 lb

## 2023-01-11 DIAGNOSIS — C61 Malignant neoplasm of prostate: Secondary | ICD-10-CM

## 2023-01-11 DIAGNOSIS — Z803 Family history of malignant neoplasm of breast: Secondary | ICD-10-CM

## 2023-01-11 DIAGNOSIS — Z8042 Family history of malignant neoplasm of prostate: Secondary | ICD-10-CM

## 2023-01-11 DIAGNOSIS — Z1379 Encounter for other screening for genetic and chromosomal anomalies: Secondary | ICD-10-CM

## 2023-01-11 LAB — GENETIC SCREENING ORDER

## 2023-01-11 NOTE — Progress Notes (Signed)
Radiation Oncology         [phone] ________________________________  Multidisciplinary Prostate Cancer Clinic  Initial Radiation Oncology Consultation  Name: Lawrence Macdonald MRN: [id number]  Date: 01/11/2023  DOB: 1956/01/13  WU:JWJX, Netta Corrigan., MD  Bjorn Pippin, MD   REFERRING PHYSICIAN: Bjorn Pippin, MD  DIAGNOSIS: 67 y.o. gentleman with stage T1c adenocarcinoma of the prostate with a Gleason's score of 3+4 and a PSA of 30    ICD-10-CM   1. Malignant neoplasm of prostate (HCC)  C61       HISTORY OF PRESENT ILLNESS::Lawrence Macdonald is a 67 y.o. gentleman.  He was noted to have an elevated PSA of 35.9 by his primary care physician, Dr. Clelia Croft.  Accordingly, he was referred for evaluation in urology by Dr. Annabell Howells on 11/03/22,  digital rectal examination performed at that time showed no nodules or induration. A repeat PSA obtained that day showed a slight decrease but remained significantly elevated at 30.  Therefore, the patient proceeded to transrectal ultrasound with 12 biopsies of the prostate on 11/12/22.  The prostate volume measured 58 cc.  Out of 14 core biopsies, 11 were positive.  The maximum Gleason score was 3+4, and this was seen in the right base lateral (small focus), right apex, right mid, both samples from left transition zone, left apex, left mid, and left base. Additionally, Gleason 3+3 was seen in the left base lateral (small focus), left mid lateral, left apex lateral (small focus), and right mid lateral (two small foci).     He underwent staging PSMA PET scan on 12/06/22 showing no evidence of disease outside of the prostate.  The patient reviewed the biopsy and imaging results with his urologist and he has kindly been referred today to the multidisciplinary prostate cancer clinic for presentation of pathology and radiology studies in our conference for discussion of potential radiation treatment options and clinical evaluation.  PREVIOUS RADIATION THERAPY:  No  PAST MEDICAL HISTORY:  has a past medical history of Diabetes mellitus without complication (HCC) and Hypertension.    PAST SURGICAL HISTORY: Past Surgical History:  Procedure Laterality Date   CHOLECYSTECTOMY N/A 08/23/2020   Procedure: LAPAROSCOPIC CHOLECYSTECTOMY WITH INTRAOPERATIVE CHOLANGIOGRAM;  Surgeon: Gaynelle Adu, MD;  Location: [facility];  Service: General;  Laterality: N/A;   ERCP N/A 08/24/2020   Procedure: ENDOSCOPIC RETROGRADE CHOLANGIOPANCREATOGRAPHY (ERCP);  Surgeon: Vida Rigger, MD;  Location: [facility];  Service: Endoscopy;  Laterality: N/A;   inguinal hernia Left    PROSTATE BIOPSY     REMOVAL OF STONES  08/24/2020   Procedure: REMOVAL OF STONES;  Surgeon: Vida Rigger, MD;  Location: [facility];  Service: Endoscopy;;   SPHINCTEROTOMY  08/24/2020   Procedure: Dennison Mascot;  Surgeon: Vida Rigger, MD;  Location: [facility];  Service: Endoscopy;;   TONSILLECTOMY      FAMILY HISTORY: family history includes Breast cancer in his mother.  SOCIAL HISTORY:  reports that he has never smoked. He has never used smokeless tobacco. He reports current alcohol use. He reports that he does not use drugs.  He is a retired Retail banker.  ALLERGIES: Hydrochlorothiazide, Lisinopril, and Sulfadiazine  MEDICATIONS:  Current Outpatient Medications  Medication Sig Dispense Refill   fluticasone (FLONASE) 50 MCG/ACT nasal spray Place 1 spray into both nostrils as needed for allergies or rhinitis.     ORGOVYX 120 MG tablet Take 120 mg by mouth daily.     acetaminophen (TYLENOL) 500 MG tablet You can take 1000 mg of Tylenol/acetaminophen every  6 hours as needed for pain.  Do not exceed 4000 mg of Tylenol per day it can harm your liver.  You can buy this over-the-counter at any drugstore.  You can use naproxen in addition to plain Tylenol for pain. 30 tablet 0   albuterol (VENTOLIN HFA) 108 (90 Base) MCG/ACT inhaler      amLODipine (NORVASC) 10 MG tablet Take 10 mg by mouth daily.      amoxicillin-clavulanate (AUGMENTIN) 875-125 MG tablet Take 1 tablet by mouth 2 (two) times daily. One po bid x 7 days 14 tablet 0   atorvastatin (LIPITOR) 80 MG tablet Take 80 mg by mouth daily.     benzonatate (TESSALON) 100 MG capsule Take 1 capsule (100 mg total) by mouth 3 (three) times daily as needed for cough. 21 capsule 0   docusate sodium (COLACE) 100 MG capsule Take 1 capsule (100 mg total) by mouth 2 (two) times daily. 10 capsule 0   guaiFENesin 200 MG tablet Take 1 tablet (200 mg total) by mouth every 4 (four) hours as needed for cough or to loosen phlegm. 30 suppository 0   hydrALAZINE (APRESOLINE) 100 MG tablet Take 100 mg by mouth 2 (two) times daily.     metFORMIN (GLUCOPHAGE-XR) 500 MG 24 hr tablet SMARTSIG:1 Tablet(s) By Mouth Every Evening     metroNIDAZOLE (FLAGYL) 500 MG tablet Take 1 tablet (500 mg total) by mouth 2 (two) times daily. One po bid x 7 days 14 tablet 0   naproxen sodium (ALEVE) 220 MG tablet You can take 1-2 tablets every 12 hours as needed for pain.  You can use this in addition to the plain Tylenol/acetaminophen.  You can buy this over-the-counter at any drugstore.     valsartan (DIOVAN) 320 MG tablet Take 320 mg by mouth daily.     No current facility-administered medications for this encounter.    REVIEW OF SYSTEMS:  On review of systems, the patient reports that he is doing well overall. He denies any chest pain, shortness of breath, cough, fevers, chills, night sweats, unintended weight changes. He denies any bowel disturbances, and denies abdominal pain, nausea or vomiting. He denies any new musculoskeletal or joint aches or pains. His IPSS was 16, indicating moderate urinary symptoms. His SHIM was 11, indicating he has moderate erectile dysfunction. A complete review of systems is obtained and is otherwise negative.   PHYSICAL EXAM:  Wt Readings from Last 3 Encounters:  01/11/23 241 lb 9.6 oz (109.6 kg)  08/22/20 255 lb (115.7 kg)   Temp Readings  from Last 3 Encounters:  01/11/23 (!) 97.3 F (36.3 C)  09/21/21 (!) 100.7 F (38.2 C) (Oral)  09/21/21 98.9 F (37.2 C) (Oral)   BP Readings from Last 3 Encounters:  01/11/23 131/87  09/21/21 130/81  09/21/21 138/77   Pulse Readings from Last 3 Encounters:  01/11/23 66  09/21/21 (!) 53  09/21/21 71    /10  In general this is a well appearing African-American man in no acute distress. He's alert and oriented x4 and appropriate throughout the examination. Cardiopulmonary assessment is negative for acute distress and he exhibits normal effort.    KPS = 100  100 - Normal; no complaints; no evidence of disease. 90   - Able to carry on normal activity; minor signs or symptoms of disease. 80   - Normal activity with effort; some signs or symptoms of disease. 73   - Cares for self; unable to carry on normal activity or to  do active work. 60   - Requires occasional assistance, but is able to care for most of his personal needs. 50   - Requires considerable assistance and frequent medical care. 40   - Disabled; requires special care and assistance. 30   - Severely disabled; hospital admission is indicated although death not imminent. 20   - Very sick; hospital admission necessary; active supportive treatment necessary. 10   - Moribund; fatal processes progressing rapidly. 0     - Dead  Karnofsky DA, Abelmann WH, Craver LS and Burchenal [facility] [phone]) The use of the nitrogen mustards in the palliative treatment of carcinoma: with particular reference to bronchogenic carcinoma Cancer 1 [phone]   LABORATORY DATA:  Lab Results  Component Value Date   WBC 4.1 09/21/2021   HGB 13.6 09/21/2021   HCT 41.5 09/21/2021   MCV 84.3 09/21/2021   PLT PLATELET CLUMPS NOTED ON SMEAR, UNABLE TO ESTIMATE 09/21/2021   Lab Results  Component Value Date   NA 137 09/21/2021   K 3.9 09/21/2021   CL 103 09/21/2021   CO2 26 09/21/2021   Lab Results  Component Value Date   ALT 78 (H) 09/21/2021   AST  79 (H) 09/21/2021   ALKPHOS 78 09/21/2021   BILITOT 1.4 (H) 09/21/2021     RADIOGRAPHY: No results found.    IMPRESSION/PLAN: 67 y.o. gentleman with Stage T1c adenocarcinoma of the prostate with a Gleason score of 3+4 and a PSA of 30.    We discussed the patient's workup and outlined the nature of prostate cancer in this setting. The patient's T stage, Gleason's score, and PSA put him into the high risk group. Accordingly, he is eligible for a variety of potential treatment options including prostatectomy or 8 weeks of external radiation concurrent with LT-ADT. We discussed the available radiation techniques, and focused on the details and logistics of delivery. We discussed and outlined the risks, benefits, short and long-term effects associated with radiotherapy and compared and contrasted these with prostatectomy. We discussed the role of SpaceOAR gel in reducing the rectal toxicity associated with radiotherapy.  We also detailed the role of ADT in the treatment of high risk prostate cancer and outlined the associated side effects that could be expected with this therapy.   The patient focused most of his questions and interest in robotic-assisted laparoscopic radical prostatectomy.  We discussed some of the potential advantages of surgery including surgical staging, the availability of salvage radiotherapy to the prostatic fossa, and the confidence associated with immediate biochemical response.  We discussed some of the potential proven indications for postoperative radiotherapy including positive margins, extracapsular extension, and seminal vesicle involvement. We also talked about some of the other potential findings leading to a recommendation for radiotherapy including a non-zero postoperative PSA and positive lymph nodes.  He appears to have a good understanding of his disease and our treatment recommendations which are of curative intent.  He was encouraged to ask questions that were  answered to his stated satisfaction.  At the end of the conversation the patient is interested in moving forward with prostatectomy. We enjoyed meeting with him today, and will look forward to following his progress.  We personally spent 60 minutes in this encounter including chart review, reviewing radiological studies, meeting face-to-face with the patient, entering orders and completing documentation.    Marguarite Arbour, PA-C    Margaretmary Dys, MD  [facility]  Radiation Oncology Direct Dial: [phone]  Fax: [phone] [email or web address]  Skype  [email or web address]  This document serves as a record of services personally performed by Margaretmary Dys, MD and Marcello Fennel, PA-C. It was created on their behalf by Mickie Bail, a trained medical scribe. The creation of this record is based on the scribe's personal observations and the provider's statements to them. This document has been checked and approved by the attending provider.

## 2023-01-11 NOTE — Consult Note (Signed)
Multi-Disciplinary Clinic     01/11/2023   --------------------------------------------------------------------------------   Lawrence Macdonald  MRN: [id number]  DOB: 1955/11/18, 67 year old Male  SSN:    PRIMARY CARE:  W Leola Brazil, MD  PRIMARY CARE FAX:  [phone]  REFERRING:  Cristal Deer A. Liliane Shi, MD  PROVIDER:  Bjorn Pippin, M.D.  TREATING:  Heloise Purpura, M.D.  LOCATION:  [facility] [phone]     --------------------------------------------------------------------------------   CC/HPI: CC: Prostate Cancer   Physician requesting consult: Dr. Bjorn Pippin  PCP: Dr. Eric Form  Location of consult: [facility] - Prostate Cancer Multidisciplinary Clinic   Lawrence Macdonald is a 67 year old gentleman who was found to have an elevated PSA of 30.0 prompting a TRUS biopsy of the prostate by Dr. Annabell Howells on 09/21/1955. 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 He actually was started on Orgovyx by Dr. Annabell Howells 1 week ago. He is having expected hot flashes.   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 This was his first PSA drawn in at least 6 years.   Family history: None.   Imaging studies: PSMA PET scan (12/06/22) - Negative for metastatic disease.   PMH: He has a history of hypertension, hyperlipidemia, and diabetes.  PSH: Laparoscopic cholecystectomy.   TNM stage: cT1c N0 M0  PSA: 30.0  Gleason score: 3+4=7 (GG 2)  Biopsy (11/12/22): 12/14 cores positive  Left: L lateral apex (5%, 3+3=6), L apex (10%, 3+4=7), L lateral mid (20%, 3+3=6), L mid (80%, 3+4=7), L lateral base (5%, 3+3=6), L base (10%, 3+4=7)  Right: R apex (60%, 3+4=7), R lateral apex (5%, 3+4=7), R mid (60%, 3+4=7), R lateral mid (40%, 3+3=6)  Left transition zone: 2/2 cores (90% and 95%, 3+4=7)  Prostate volume: 58 cc   Nomogram  OC disease: 9%  EPE: 90%  SVI: 35%  LNI: 30%  PFS (5 year, 10  year): 41%, 27%   Urinary function: IPSS is 16.  Erectile function: SHIM score is 11. He has been nonresponsive to PDE 5 inhibitor therapy.     ALLERGIES: Hydro-chlorthiazide Lisinopril Sulfa - Skin Rash    MEDICATIONS: Amlodipine Besylate 10 mg tablet  Atorvastatin Calcium 80 mg tablet  Hydralazine Hcl 100 mg tablet  Levofloxacin 750 mg tablet 1 po 1 hour prior to the procedure  Metformin Er Gastric 1,000 mg tablet, er gastric retention 24 hr  Orgovyx 120 mg tablet take 3 tabs po and then 1 tab daily.  Stool Softener  Tylenol Arthritis  Valsartan 320 mg tablet     GU PSH: Prostate Needle Biopsy - 11/12/2022     NON-GU PSH: Cholecystectomy (laparoscopic) Surgical Pathology, Gross And Microscopic Examination For Prostate Needle - 11/12/2022 Tonsillectomy Visit Complexity (formerly GPC1X) - 12/20/2022     GU PMH: Prostate Cancer - 12/29/2022, T1c N0 M0 GG2 high risk prostate cancer (high volume/high PSA) with moderate ED and LUTS in a 59ml prostate. I discussed options and primarily reviewed surgical therapy and radiation therapy with associated ADT. I will get him set up with the [facility]. I will get him started on ADT and he prefers oral therapy so I will see if we can get that approved for him. I revewed the risks and benefits of surgery, radiation, SpaceOAR and fiducials and ADT in detail and gave him the appropriate patient materials with the details. He has a left inguinal hernia with  bladder invovlement so I reached out to Dr. Andrey Campanile, his prior surgeon, for his recommendations. , - 12/20/2022 BPH w/LUTS - 12/20/2022, He has mild/moderate LUTS. His exam is benign but large. , - 11/03/2022 ED due to arterial insufficiency - 12/20/2022 Elevated PSA - 12/20/2022, - 11/12/2022, He has a markedly elevated PSA and there was some asymmetry of the prostate on his prior CT last year. I am going to repeat a PSA today and if it remains elevated, I will get him in for a prostate [place] and biopsy ASAP. I  have reviewed the risks of bleeding, infection and voiding difficulty and sent Levaquin. , - 11/03/2022 Urinary Urgency - 12/20/2022, - 11/03/2022    NON-GU PMH: Diabetes Type 2 Hypertension    FAMILY HISTORY: Death In The Family Father - Other Death In The Family Mother - Other Dementia - Mother Diabetes - Father Hypertension - Father Kidney Failure - Father   SOCIAL HISTORY: Marital Status: Divorced Preferred Language: English; Ethnicity: Not Hispanic Or Latino; Race: Black or African American Current Smoking Status: Patient does not smoke anymore. Has not smoked since 11/02/1993. Smoked for 10 years. Smoked 1 pack per day.   Tobacco Use Assessment Completed: Used Tobacco in last 30 days? Does not use smokeless tobacco. Does not use drugs. Drinks 2 caffeinated drinks per day.    REVIEW OF SYSTEMS:    GU Review Male:   Patient denies frequent urination, hard to postpone urination, burning/ pain with urination, get up at night to urinate, leakage of urine, stream starts and stops, trouble starting your streams, and have to strain to urinate .  Gastrointestinal (Upper):   Patient denies nausea and vomiting.  Gastrointestinal (Lower):   Patient denies diarrhea and constipation.  Constitutional:   Patient denies fever, night sweats, weight loss, and fatigue.  Skin:   Patient denies skin rash/ lesion and itching.  Eyes:   Patient denies blurred vision and double vision.  Ears/ Nose/ Throat:   Patient denies sore throat and sinus problems.  Hematologic/Lymphatic:   Patient denies swollen glands and easy bruising.  Cardiovascular:   Patient denies leg swelling and chest pains.  Respiratory:   Patient denies cough and shortness of breath.  Endocrine:   Patient denies excessive thirst.  Musculoskeletal:   Patient denies back pain and joint pain.  Neurological:   Patient denies dizziness and headaches.  Psychologic:   Patient denies depression and anxiety.   VITAL SIGNS: None   GU PHYSICAL  EXAMINATION:    Prostate: His prostate is difficult to feel due to his body habitus but there is no clear induration or nodularity noted.   MULTI-SYSTEM PHYSICAL EXAMINATION:    Constitutional: Well-nourished. No physical deformities. Normally developed. Good grooming.  Respiratory: No labored breathing, no use of accessory muscles. Clear bilaterally.  Cardiovascular: Normal temperature, normal extremity pulses, no swelling, no varicosities. Regular rate and rhythm.  Gastrointestinal: No mass, no tenderness, no rigidity, non obese abdomen.     Complexity of Data:  Lab Test Review:   PSA  Records Review:   Pathology Reports, Previous Patient Records  X-Ray Review: PET- PSMA Scan: Reviewed Films.     11/03/22 10/05/22  PSA  Total PSA 30.00 ng/mL 35.883 ng/ml    PROCEDURES: None   ASSESSMENT:      ICD-10 Details  1 GU:   Prostate Cancer - C61    PLAN:           Document Letter(s):  Created for Patient: Clinical Summary  Created for Patient: Clinical Summary         Notes:   1. Unfavorable intermediate risk prostate cancer: I had a detailed discussion with Mr. Barder today regarding his prostate cancer situation. Taking into account his age/life expectancy and his disease parameters, I did recommend therapy of curative intent.   The patient was counseled about the natural history of prostate cancer and the standard treatment options that are available for prostate cancer. It was explained to him how his age and life expectancy, clinical stage, Gleason score/prognostic grade group, and PSA (and PSA density) affect his prognosis, the decision to proceed with additional staging studies, as well as how that information influences recommended treatment strategies. We discussed the roles for active surveillance, radiation therapy, surgical therapy, androgen deprivation, as well as ablative therapy and other investigational options for the treatment of prostate cancer as appropriate to his  individual cancer situation. We discussed the risks and benefits of these options with regard to their impact on cancer control and also in terms of potential adverse events, complications, and impact on quality of life particularly related to urinary and sexual function. The patient was encouraged to ask questions throughout the discussion today and all questions were answered to his stated satisfaction. In addition, the patient was provided with and/or directed to appropriate resources and literature for further education about prostate cancer and treatment options. We discussed surgical therapy for prostate cancer including the different available surgical approaches. We discussed, in detail, the risks and expectations of surgery with regard to cancer control, urinary control, and erectile function as well as the expected postoperative recovery process. Additional risks of surgery including but not limited to bleeding, infection, hernia formation, nerve damage, lymphocele formation, bowel/rectal injury potentially necessitating colostomy, damage to the urinary tract resulting in urine leakage, urethral stricture, and the cardiopulmonary risks such as myocardial infarction, stroke, death, venothromboembolism, etc. were explained. The risk of open surgical conversion for robotic/laparoscopic prostatectomy was also discussed.   He is scheduled to meet with Dr. Kathrynn Running later this afternoon but has tentatively made the decision that he most likely wants to proceed with surgical therapy. I have instructed him to go ahead and stop Orgovyx. My surgical plan would be to perform a non-nerve sparing robot-assisted laparoscopic radical prostatectomy and bilateral pelvic lymphadenectomy considering his pre-existing erectile dysfunction and high-volume and high risk disease.   CC: Dr. Eric Form  Dr. Bjorn Pippin  Dr. Margaretmary Dys           Next Appointment:      Next Appointment: 01/26/2023 10:30 AM    Appointment  Type: Laboratory Appointment    Location: [facility] [phone]    Provider: Lab LAB    Reason for Visit: 1 mnth cmp,total t,psa      E & M CODES: We spent 62 minutes dedicated to evaluation and management time, including face to face interaction, discussions on coordination of care, documentation, result review, and discussion with others as applicable.

## 2023-01-11 NOTE — Progress Notes (Addendum)
REFERRING PROVIDER: Margaretmary Dys, MD  PRIMARY PROVIDER:  Cleatis Polka., MD  PRIMARY REASON FOR VISIT:  1. Malignant neoplasm of prostate (HCC)   2. Family history of breast cancer   3. Family history of prostate cancer     HISTORY OF PRESENT ILLNESS:   Lawrence Macdonald, a 67 y.o. male, was seen for a [place] cancer genetics consultation at the request of Dr. Kathrynn Running due to a personal and family history of cancer.  Lawrence Macdonald presents to clinic today to discuss the possibility of a hereditary predisposition to cancer, to discuss genetic testing, and to further clarify his future cancer risks, as well as potential cancer risks for family members.   In May 2024, at the age of 74, Lawrence Macdonald was diagnosed with high risk prostate cancer (Gleason score: 7, PSA: 30).  CANCER HISTORY:  Oncology History  Malignant neoplasm of prostate (HCC)  11/12/2022 Cancer Staging   Staging form: Prostate, AJCC 8th Edition - Clinical stage from 11/12/2022: Stage IIIA (cT1c, cN0, cM0, PSA: 30, Grade Group: 2) - Signed by Marcello Fennel, PA-C on 01/11/2023 Histopathologic type: Adenocarcinoma, NOS Stage prefix: Initial diagnosis Prostate specific antigen (PSA) range: 20 or greater Gleason primary pattern: 3 Gleason secondary pattern: 4 Gleason score: 7 Histologic grading system: 5 grade system Number of biopsy cores examined: 10 Number of biopsy cores positive: 12 Location of positive needle core biopsies: Both sides   01/11/2023 Initial Diagnosis   Malignant neoplasm of prostate [facility])      Past Medical History:  Diagnosis Date   Diabetes mellitus without complication (HCC)    Hypertension     Past Surgical History:  Procedure Laterality Date   CHOLECYSTECTOMY N/A 08/23/2020   Procedure: LAPAROSCOPIC CHOLECYSTECTOMY WITH INTRAOPERATIVE CHOLANGIOGRAM;  Surgeon: Gaynelle Adu, MD;  Location: [facility];  Service: General;  Laterality: N/A;   ERCP N/A 08/24/2020   Procedure: ENDOSCOPIC  RETROGRADE CHOLANGIOPANCREATOGRAPHY (ERCP);  Surgeon: Vida Rigger, MD;  Location: [facility];  Service: Endoscopy;  Laterality: N/A;   inguinal hernia Left    PROSTATE BIOPSY     REMOVAL OF STONES  08/24/2020   Procedure: REMOVAL OF STONES;  Surgeon: Vida Rigger, MD;  Location: [facility];  Service: Endoscopy;;   SPHINCTEROTOMY  08/24/2020   Procedure: Dennison Mascot;  Surgeon: Vida Rigger, MD;  Location: [facility];  Service: Endoscopy;;   TONSILLECTOMY      Social History   Socioeconomic History   Marital status: Single    Spouse name: Not on file   Number of children: Not on file   Years of education: Not on file   Highest education level: Not on file  Occupational History   Not on file  Tobacco Use   Smoking status: Never   Smokeless tobacco: Never  Substance and Sexual Activity   Alcohol use: Yes   Drug use: Never   Sexual activity: Not on file  Other Topics Concern   Not on file  Social History Narrative   Not on file   Social Determinants of Health   Financial Resource Strain: Not on file  Food Insecurity: Not on file  Transportation Needs: Not on file  Physical Activity: Not on file  Stress: Not on file  Social Connections: Not on file     FAMILY HISTORY:  We obtained a detailed, 4-generation family history.  Significant diagnoses are listed below: Family History  Problem Relation Age of Onset   Breast cancer Mother 69 - 69   Prostate cancer Brother 74 -  57       paternal half-brother   Breast cancer Maternal Aunt 43   Prostate cancer Maternal Uncle    Prostate cancer Cousin        2 maternal first cousins       Lawrence Macdonald mother was diagnosed with breast cancer in her 25s, she died at age 67. One maternal aunt was diagnosed with breast cancer at age 68 and one maternal uncle was diagnosed with prostate cancer at an unknown age. He has 2 maternal first cousins who have a history of prostate cancer. Lawrence Macdonald paternal half-brother was  diagnosed with prostate cancer in his 5s. Lawrence Macdonald is unaware of previous family history of genetic testing for hereditary cancer risks. There is no reported Ashkenazi Jewish ancestry.   GENETIC COUNSELING ASSESSMENT: Lawrence Macdonald is a 67 y.o. male with a personal and family history of cancer which is somewhat suggestive of a hereditary predisposition to cancer. We, therefore, discussed and recommended the following at today's visit.   DISCUSSION: We discussed that 5 - 10% of cancer is hereditary, with most cases of prostate cancer associated with BRCA1/2.  There are other genes that can be associated with hereditary prostate cancer syndromes.  We discussed that testing is beneficial for several reasons including knowing how to follow individuals after completing their treatment, identifying whether potential treatment options would be beneficial, and understanding if other family members could be at risk for cancer and allowing them to undergo genetic testing.   We reviewed the characteristics, features and inheritance patterns of hereditary cancer syndromes. We also discussed genetic testing, including the appropriate family members to test, the process of testing, insurance coverage and turn-around-time for results. We discussed the implications of a negative, positive, carrier and/or variant of uncertain significant result. We recommended Lawrence Macdonald pursue genetic testing for a panel that includes genes associated with prostate and breast cancer.   Lawrence Macdonald elected to have [facility] Custom Panel. The Custom Hereditary Cancers Panel offered by [facility] includes sequencing and/or deletion duplication testing of the following 43 genes: APC, ATM, AXIN2, BAP1, BARD1, BMPR1A, BRCA1, BRCA2, BRIP1, CDH1, CDK4, CDKN2A (p14ARF and p16INK4a only), CHEK2, CTNNA1, EPCAM (Deletion/duplication testing only), FH, GREM1 (promoter region duplication testing only), HOXB13, KIT, MBD4, MEN1, MLH1, MSH2, MSH3, MSH6,  MUTYH, NF1, NHTL1, PALB2, PDGFRA, PMS2, POLD1, POLE, PTEN, RAD51C, RAD51D, SMAD4, SMARCA4. STK11, TP53, TSC1, TSC2, and VHL.  Based on Lawrence Macdonald personal and family history of cancer, he meets medical criteria for genetic testing. Despite that he meets criteria, he may still have an out of pocket cost. We discussed that if his out of pocket cost for testing is over $100, the laboratory will call and confirm whether he wants to proceed with testing.  If the out of pocket cost of testing is less than $100 he will be billed by the genetic testing laboratory.   PLAN: After considering the risks, benefits, and limitations, Lawrence Macdonald provided informed consent to pursue genetic testing and the blood sample was sent to [facility] for analysis of the Custom Panel. Results should be available within approximately 2-3 weeks' time, at which point they will be disclosed by telephone to Lawrence Macdonald, as will any additional recommendations warranted by these results. Lawrence Macdonald will receive a summary of his genetic counseling visit and a copy of his results once available. This information will also be available in Epic.   Lawrence Macdonald questions were answered to his satisfaction today. Our contact information was provided  should additional questions or concerns arise. Thank you for the referral and allowing [place] to share in the care of your patient.   Lalla Brothers, MS, [facility] Genetic Counselor [email or web address] (P) [phone]  The patient was seen for a total of 20 minutes in face-to-face genetic counseling.  The patient brought his friend. Drs. Pamelia Hoit and/or Mosetta Putt were available to discuss this case as needed.   _______________________________________________________________________ For Office Staff:  Number of people involved in session: 2 Was an Intern/ student involved with case: no

## 2023-01-12 ENCOUNTER — Other Ambulatory Visit: Payer: Self-pay | Admitting: Urology

## 2023-01-18 DIAGNOSIS — C61 Malignant neoplasm of prostate: Secondary | ICD-10-CM

## 2023-01-18 NOTE — Progress Notes (Signed)
RN spoke with patient to follow up from recent [facility] visit.  Patient has been scheduled for his robotic prostatectomy for 8/1.  Patient also has his PT Pre-Op on 7/17.  Patient has no additional needs at this time.

## 2023-01-19 NOTE — Patient Instructions (Addendum)
SURGICAL WAITING ROOM VISITATION Patients having surgery or a procedure may have no more than 2 support people in the waiting area - these visitors may rotate in the visitor waiting room.   Due to an increase in RSV and influenza rates and associated hospitalizations, children ages 69 and under may not visit patients in [facility] hospitals. If the patient needs to stay at the hospital during part of their recovery, the visitor guidelines for inpatient rooms apply.  PRE-OP VISITATION  Pre-op nurse will coordinate an appropriate time for 1 support person to accompany the patient in pre-op.  This support person may not rotate.  This visitor will be contacted when the time is appropriate for the visitor to come back in the pre-op area.  Please refer to the [facility] website for the visitor guidelines for Inpatients (after your surgery is over and you are in a regular room).  You are not required to quarantine at this time prior to your surgery. However, you must do this: Hand Hygiene often Do NOT share personal items Notify your provider if you are in close contact with someone who has COVID or you develop fever 100.4 or greater, new onset of sneezing, cough, sore throat, shortness of breath or body aches.  If you test positive for Covid or have been in contact with anyone that has tested positive in the last 10 days please notify you surgeon.    Your procedure is scheduled on:  Thursday   February 03, 2023  Report to [facility] Main Entrance: [facility]sen entrance where the [employer] is available.   Report to admitting at:   09:00  AM  Call this number if you have any questions or problems the morning of surgery [phone]  DO NOT EAT OR DRINK ANYTHING AFTER MIDNIGHT THE NIGHT PRIOR TO YOUR SURGERY / PROCEDURE.     FOLLOW BOWEL PREP AND ANY ADDITIONAL PRE OP INSTRUCTIONS YOU RECEIVED FROM YOUR SURGEON'S OFFICE!!!  MAGNESIUM CITRATE:  Obtain one (1)  bottle (10 oz) of  Magnesium Citrate at your pharmacy. Drink entire bottle at 12:00 noon the day before your surgery/ procedure.  FLEET ENEMA: Obtain one(1) Fleet Enema (sodium phosphate 7-19 gm / 118 ml enema) and use (according to the directions on the box) the night prior to your surgery.   If you have any questions, please contact your Surgeon's office for additional information.      Oral Hygiene is also important to reduce your risk of infection.        Remember - BRUSH YOUR TEETH THE MORNING OF SURGERY WITH YOUR REGULAR TOOTHPASTE  Do NOT smoke after Midnight the night before surgery.   METFORMIN:  you may take this the DAY BEFORE your surgery but DO NOT TAKE METFORMIN on the day of your surgery.   Take ONLY these medicines the morning of surgery with A SIP OF WATER: amlodipine, hydralazine                 You may not have any metal on your body including jewelry, and body piercing    Do not wear  lotions, powders, cologne, or deodorant  Men may shave face and neck.  Contacts, Hearing Aids, dentures or bridgework may not be worn into surgery. DENTURES WILL BE REMOVED PRIOR TO SURGERY PLEASE DO NOT APPLY "Poly grip" OR ADHESIVES!!!  You may bring a small overnight bag with you on the day of surgery, only pack items that are not valuable. [facility]  IS NOT RESPONSIBLE   FOR VALUABLES THAT ARE LOST OR STOLEN.   Do not bring your home medications to the hospital. The Pharmacy will dispense medications listed on your medication list to you during your admission in the Hospital.  Please read over the following fact sheets you were given: IF YOU HAVE QUESTIONS ABOUT YOUR PRE-OP INSTRUCTIONS, PLEASE CALL [phone].   [place] - Preparing for Surgery Before surgery, you can play an important role.  Because skin is not sterile, your skin needs to be as free of germs as possible.  You can reduce the number of germs on your skin by washing with CHG (chlorahexidine gluconate) soap before surgery.   CHG is an antiseptic cleaner which kills germs and bonds with the skin to continue killing germs even after washing. Please DO NOT use if you have an allergy to CHG or antibacterial soaps.  If your skin becomes reddened/irritated stop using the CHG and inform your nurse when you arrive at Short Stay. Do not shave (including legs and underarms) for at least 48 hours prior to the first CHG shower.  You may shave your face/neck.  Please follow these instructions carefully:  1.  Shower with CHG Soap the night before surgery and the  morning of surgery.  2.  If you choose to wash your hair, wash your hair first as usual with your normal  shampoo.  3.  After you shampoo, rinse your hair and body thoroughly to remove the shampoo.                             4.  Use CHG as you would any other liquid soap.  You can apply chg directly to the skin and wash.  Gently with a scrungie or clean washcloth.  5.  Apply the CHG Soap to your body ONLY FROM THE NECK DOWN.   Do not use on face/ open                           Wound or open sores. Avoid contact with eyes, ears mouth and genitals (private parts).                       Wash face,  Genitals (private parts) with your normal soap.             6.  Wash thoroughly, paying special attention to the area where your  surgery  will be performed.  7.  Thoroughly rinse your body with warm water from the neck down.  8.  DO NOT shower/wash with your normal soap after using and rinsing off the CHG Soap.            9.  Pat yourself dry with a clean towel.            10.  Wear clean pajamas.            11.  Place clean sheets on your bed the night of your first shower and do not  sleep with pets.  ON THE DAY OF SURGERY : Do not apply any lotions/deodorants the morning of surgery.  Please wear clean clothes to the hospital/surgery center.    FAILURE TO FOLLOW THESE INSTRUCTIONS MAY RESULT IN THE CANCELLATION OF YOUR SURGERY  PATIENT  SIGNATURE_________________________________  NURSE SIGNATURE__________________________________  ________________________________________________________________________

## 2023-01-19 NOTE — Progress Notes (Addendum)
COVID Vaccine received:  []  No [x]  Yes Date of any COVID positive Test in last 90 days:  None  PCP - Martha Clan, Montez Hageman  MD Cardiologist -  None  Chest x-ray - 09-21-2021  2v  Epic EKG -  01-20-2023  Epic Stress Test -  ECHO -  Cardiac Cath -   PCR screen: []  Ordered & Completed           []   No Order but Needs PROFEND           [x]   N/A for this surgery  Surgery Plan:  []  Ambulatory                            [x]  Outpatient in bed                            []  Admit  Anesthesia:    [x]  General  []  Spinal                           []   Choice []   MAC  Bowel Prep - []  No  [x]   Yes __Mag. Citrate & Fleet enema,  patient is aware  Pacemaker / ICD device [x]  No []  Yes   Spinal Cord Stimulator:[x]  No []  Yes       History of Sleep Apnea? [x]  No []  Yes   CPAP used?- [x]  No []  Yes    Does the patient monitor blood sugar?          []  No [x]  Yes  []  N/A Last A1c in 2022 was 7.2 Patient has: []  NO Hx DM   []  Pre-DM                 []  DM1  [x]   DM2 Does patient have a [employer] or Dexacom? [x]  No []  Yes   Fasting Blood Sugar Ranges- 110-130 Checks Blood Sugar _3-4  times a week Diabetic medications/ instructions: Metformin  500 mg daily  Blood Thinner / Instructions: none Aspirin Instructions:  none  ERAS Protocol Ordered: [x]  No  []  Yes Patient is to be NPO after: Midnight prior  Comments: Patient is a retired Retail banker.   Activity level: Patient is able to climb a flight of stairs without difficulty; [x]  No CP  but would have some SOB.  Patient can perform ADLs without assistance.   Anesthesia review: DM2, HTN  Patient denies shortness of breath, fever, cough and chest pain at PAT appointment.  Patient verbalized understanding and agreement to the Pre-Surgical Instructions that were given to them at this PAT appointment. Patient was also educated of the need to review these PAT instructions again prior to his surgery.I reviewed the appropriate phone numbers to call  if they have any and questions or concerns.

## 2023-01-20 ENCOUNTER — Encounter (HOSPITAL_COMMUNITY): Payer: Self-pay

## 2023-01-20 ENCOUNTER — Encounter (HOSPITAL_COMMUNITY)
Admission: RE | Admit: 2023-01-20 | Discharge: 2023-01-20 | Disposition: A | Discharge status: Home / Self Care | Payer: Medicare Other | Source: Ambulatory Visit | Attending: Urology | Admitting: Urology

## 2023-01-20 ENCOUNTER — Other Ambulatory Visit: Payer: Self-pay

## 2023-01-20 VITALS — BP 132/74 | HR 57 | Temp 98.1°F | Resp 16 | Ht 70.0 in | Wt 239.0 lb

## 2023-01-20 DIAGNOSIS — E119 Type 2 diabetes mellitus without complications: Secondary | ICD-10-CM | POA: Insufficient documentation

## 2023-01-20 DIAGNOSIS — R9431 Abnormal electrocardiogram [ECG] [EKG]: Secondary | ICD-10-CM | POA: Insufficient documentation

## 2023-01-20 DIAGNOSIS — Z01818 Encounter for other preprocedural examination: Secondary | ICD-10-CM | POA: Diagnosis not present

## 2023-01-20 DIAGNOSIS — I10 Essential (primary) hypertension: Secondary | ICD-10-CM | POA: Diagnosis not present

## 2023-01-20 HISTORY — DX: Unspecified osteoarthritis, unspecified site: M19.90

## 2023-01-20 HISTORY — DX: Pneumonia, unspecified organism: J18.9

## 2023-01-20 HISTORY — DX: Malignant (primary) neoplasm, unspecified: C80.1

## 2023-01-20 LAB — BASIC METABOLIC PANEL
Anion gap: 9 (ref 5–15)
BUN: 32 mg/dL — ABNORMAL HIGH (ref 8–23)
CO2: 24 mmol/L (ref 22–32)
Calcium: 9.2 mg/dL (ref 8.9–10.3)
Chloride: 106 mmol/L (ref 98–111)
Creatinine, Ser: 1.5 mg/dL — ABNORMAL HIGH (ref 0.61–1.24)
GFR, Estimated: 50 mL/min — ABNORMAL LOW (ref >60)
Glucose, Bld: 118 mg/dL — ABNORMAL HIGH (ref 70–99)
Potassium: 3.7 mmol/L (ref 3.5–5.1)
Sodium: 139 mmol/L (ref 135–145)

## 2023-01-20 LAB — GLUCOSE, CAPILLARY: Glucose-Capillary: 113 mg/dL — ABNORMAL HIGH (ref 70–99)

## 2023-01-20 LAB — CBC
HCT: 45.1 % (ref 39.0–52.0)
Hemoglobin: 14.1 g/dL (ref 13.0–17.0)
MCH: 26.6 pg (ref 26.0–34.0)
MCHC: 31.3 g/dL (ref 30.0–36.0)
MCV: 84.9 fL (ref 80.0–100.0)
Platelets: 168 10*3/uL (ref 150–400)
RBC: 5.31 MIL/uL (ref 4.22–5.81)
RDW: 15 % (ref 11.5–15.5)
WBC: 4.9 10*3/uL (ref 4.0–10.5)
nRBC: 0 % (ref 0.0–0.2)

## 2023-01-20 LAB — TYPE AND SCREEN
ABO/RH(D): O NEG
Antibody Screen: NEGATIVE

## 2023-01-21 LAB — HEMOGLOBIN A1C
Hgb A1c MFr Bld: 6.7 % — ABNORMAL HIGH (ref 4.8–5.6)
Mean Plasma Glucose: 146 mg/dL

## 2023-01-28 ENCOUNTER — Encounter: Payer: Self-pay | Admitting: Genetic Counselor

## 2023-01-28 ENCOUNTER — Telehealth: Payer: Self-pay | Admitting: Genetic Counselor

## 2023-01-28 DIAGNOSIS — Z1379 Encounter for other screening for genetic and chromosomal anomalies: Secondary | ICD-10-CM | POA: Insufficient documentation

## 2023-01-28 NOTE — Telephone Encounter (Signed)
I attempted to contact Lawrence Macdonald to discuss his genetic testing results (43 genes). His voicemail box is full.  Lalla Brothers, MS, [facility] Genetic Counselor [email or web address] (P) [phone]

## 2023-01-31 ENCOUNTER — Telehealth: Payer: Self-pay | Admitting: Genetic Counselor

## 2023-01-31 NOTE — Telephone Encounter (Signed)
I contacted Mr. Rucci to discuss his genetic testing results. No pathogenic variants were identified in the 43 genes analyzed. Detailed clinic note to follow.  The test report has been scanned into EPIC and is located under the Molecular Pathology section of the Results Review tab.  A portion of the result report is included below for reference.   Lalla Brothers, MS, [facility] Genetic Counselor [email or web address] (P) [phone]

## 2023-02-02 NOTE — H&P (Signed)
[facility]iplinary [facility]     01/11/2023   --------------------------------------------------------------------------------   Lawrence Macdonald  MRN: [id number]  DOB: 1956-04-05, 67 year old Male  SSN:    PRIMARY CARE:  W Leola Brazil, MD  PRIMARY CARE FAX:  [phone]  REFERRING:  Cristal Deer A. Liliane Shi, MD  PROVIDER:  Bjorn Pippin, M.D.  TREATING:  Heloise Purpura, M.D.  LOCATION:  [facility] [phone]     --------------------------------------------------------------------------------   CC/HPI: CC: Prostate Cancer   Physician requesting consult: Dr. Bjorn Pippin  PCP: Dr. Eric Form  Location of consult: [facility] - Prostate Cancer Multid[facility]   [name] is a 67 year old gentleman who was found to have an elevated PSA of 30.0 prompting a TRUS biopsy of the prostate by Dr. Annabell Howells on 08/15/1953. 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 He actually was started on Orgovyx by Dr. Annabell Howells 1 week ago. He is having expected hot flashes.   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 This was his first PSA drawn in at least 6 years.   Family history: None.   Imaging studies: PSMA PET scan (12/06/22) - Negative for metastatic disease.   PMH: He has a history of hypertension, hyperlipidemia, and diabetes.  PSH: Laparoscopic cholecystectomy.   TNM stage: cT1c N0 M0  PSA: 30.0  Gleason score: 3+4=7 (GG 2)  Biopsy (11/12/22): 12/14 cores positive  Left: L lateral apex (5%, 3+3=6), L apex (10%, 3+4=7), L lateral mid (20%, 3+3=6), L mid (80%, 3+4=7), L lateral base (5%, 3+3=6), L base (10%, 3+4=7)  Right: R apex (60%, 3+4=7), R lateral apex (5%, 3+4=7), R mid (60%, 3+4=7), R lateral mid (40%, 3+3=6)  Left transition zone: 2/2 cores (90% and 95%, 3+4=7)  Prostate volume: 58 cc   Nomogram  OC disease: 9%  EPE: 90%  SVI: 35%  LNI: 30%  PFS (5 year, 10  year): 41%, 27%   Urinary function: IPSS is 16.  Erectile function: SHIM score is 11. He has been nonresponsive to PDE 5 inhibitor therapy.     ALLERGIES: Hydro-chlorthiazide Lisinopril Sulfa - Skin Rash    MEDICATIONS: Amlodipine Besylate 10 mg tablet  Atorvastatin Calcium 80 mg tablet  Hydralazine Hcl 100 mg tablet  Levofloxacin 750 mg tablet 1 po 1 hour prior to the procedure  Metformin Er Gastric 1,000 mg tablet, er gastric retention 24 hr  Orgovyx 120 mg tablet take 3 tabs po and then 1 tab daily.  Stool Softener  Tylenol Arthritis  Valsartan 320 mg tablet     GU PSH: Prostate Needle Biopsy - 11/12/2022     NON-GU PSH: Cholecystectomy (laparoscopic) Surgical Pathology, Gross And Microscopic Examination For Prostate Needle - 11/12/2022 Tonsillectomy Visit Complexity (formerly GPC1X) - 12/20/2022     GU PMH: Prostate Cancer - 12/29/2022, T1c N0 M0 GG2 high risk prostate cancer (high volume/high PSA) with moderate ED and LUTS in a 59ml prostate. I discussed options and primarily reviewed surgical therapy and radiation therapy with associated ADT. I will get him set up with the [facility]. I will get him started on ADT and he prefers oral therapy so I will see if we can get that approved for him. I revewed the risks and benefits of surgery, radiation, SpaceOAR and fiducials and ADT in detail and gave him the appropriate patient materials with the details. He has a left inguinal hernia with  bladder invovlement so I reached out to Dr. Andrey Campanile, his prior surgeon, for his recommendations. , - 12/20/2022 BPH w/LUTS - 12/20/2022, He has mild/moderate LUTS. His exam is benign but large. , - 11/03/2022 ED due to arterial insufficiency - 12/20/2022 Elevated PSA - 12/20/2022, - 11/12/2022, He has a markedly elevated PSA and there was some asymmetry of the prostate on his prior CT last year. I am going to repeat a PSA today and if it remains elevated, I will get him in for a prostate [place] and biopsy ASAP. I  have reviewed the risks of bleeding, infection and voiding difficulty and sent Levaquin. , - 11/03/2022 Urinary Urgency - 12/20/2022, - 11/03/2022    NON-GU PMH: Diabetes Type 2 Hypertension    FAMILY HISTORY: Death In The Family Father - Other Death In The Family Mother - Other Dementia - Mother Diabetes - Father Hypertension - Father Kidney Failure - Father   SOCIAL HISTORY: Marital Status: Divorced Preferred Language: English; Ethnicity: Not Hispanic Or Latino; Race: Black or African American Current Smoking Status: Patient does not smoke anymore. Has not smoked since 11/02/1993. Smoked for 10 years. Smoked 1 pack per day.   Tobacco Use Assessment Completed: Used Tobacco in last 30 days? Does not use smokeless tobacco. Does not use drugs. Drinks 2 caffeinated drinks per day.    REVIEW OF SYSTEMS:    GU Review Male:   Patient denies frequent urination, hard to postpone urination, burning/ pain with urination, get up at night to urinate, leakage of urine, stream starts and stops, trouble starting your streams, and have to strain to urinate .  Gastrointestinal (Upper):   Patient denies nausea and vomiting.  Gastrointestinal (Lower):   Patient denies diarrhea and constipation.  Constitutional:   Patient denies fever, night sweats, weight loss, and fatigue.  Skin:   Patient denies skin rash/ lesion and itching.  Eyes:   Patient denies blurred vision and double vision.  Ears/ Nose/ Throat:   Patient denies sore throat and sinus problems.  Hematologic/Lymphatic:   Patient denies swollen glands and easy bruising.  Cardiovascular:   Patient denies leg swelling and chest pains.  Respiratory:   Patient denies cough and shortness of breath.  Endocrine:   Patient denies excessive thirst.  Musculoskeletal:   Patient denies back pain and joint pain.  Neurological:   Patient denies dizziness and headaches.  Psychologic:   Patient denies depression and anxiety.   VITAL SIGNS: None   GU PHYSICAL  EXAMINATION:    Prostate: His prostate is difficult to feel due to his body habitus but there is no clear induration or nodularity noted.   MULTI-SYSTEM PHYSICAL EXAMINATION:    Constitutional: Well-nourished. No physical deformities. Normally developed. Good grooming.  Respiratory: No labored breathing, no use of accessory muscles. Clear bilaterally.  Cardiovascular: Normal temperature, normal extremity pulses, no swelling, no varicosities. Regular rate and rhythm.  Gastrointestinal: No mass, no tenderness, no rigidity, non obese abdomen.     Complexity of Data:  Lab Test Review:   PSA  Records Review:   Pathology Reports, Previous Patient Records  X-Ray Review: PET- PSMA Scan: Reviewed Films.     11/03/22 10/05/22  PSA  Total PSA 30.00 ng/mL 35.883 ng/ml    PROCEDURES: None   ASSESSMENT:      ICD-10 Details  1 GU:   Prostate Cancer - C61    PLAN:           Document Letter(s):  Created for Patient: Clinical Summary  Notes:   1. Unfavorable intermediate risk prostate cancer: I had a detailed discussion with Mr. Gurung today regarding his prostate cancer situation. Taking into account his age/life expectancy and his disease parameters, I did recommend therapy of curative intent.   The patient was counseled about the natural history of prostate cancer and the standard treatment options that are available for prostate cancer. It was explained to him how his age and life expectancy, clinical stage, Gleason score/prognostic grade group, and PSA (and PSA density) affect his prognosis, the decision to proceed with additional staging studies, as well as how that information influences recommended treatment strategies. We discussed the roles for active surveillance, radiation therapy, surgical therapy, androgen deprivation, as well as ablative therapy and other investigational options for the treatment of prostate cancer as appropriate to his individual cancer situation. We discussed  the risks and benefits of these options with regard to their impact on cancer control and also in terms of potential adverse events, complications, and impact on quality of life particularly related to urinary and sexual function. The patient was encouraged to ask questions throughout the discussion today and all questions were answered to his stated satisfaction. In addition, the patient was provided with and/or directed to appropriate resources and literature for further education about prostate cancer and treatment options. We discussed surgical therapy for prostate cancer including the different available surgical approaches. We discussed, in detail, the risks and expectations of surgery with regard to cancer control, urinary control, and erectile function as well as the expected postoperative recovery process. Additional risks of surgery including but not limited to bleeding, infection, hernia formation, nerve damage, lymphocele formation, bowel/rectal injury potentially necessitating colostomy, damage to the urinary tract resulting in urine leakage, urethral stricture, and the cardiopulmonary risks such as myocardial infarction, stroke, death, venothromboembolism, etc. were explained. The risk of open surgical conversion for robotic/laparoscopic prostatectomy was also discussed.   He is scheduled to meet with Dr. Kathrynn Running later this afternoon but has tentatively made the decision that he most likely wants to proceed with surgical therapy. I have instructed him to go ahead and stop Orgovyx. My surgical plan would be to perform a non-nerve sparing robot-assisted laparoscopic radical prostatectomy and bilateral pelvic lymphadenectomy considering his pre-existing erectile dysfunction and high-volume and high risk disease.   CC: Dr. Eric Form  Dr. Bjorn Pippin  Dr. Margaretmary Dys           Next Appointment:      Next Appointment: 01/26/2023 10:30 AM    Appointment Type: Laboratory Appointment     Location: [facility] [phone]    Provider: Lab LAB    Reason for Visit: 1 mnth cmp,total t,psa      E & M CODES: We spent 62 minutes dedicated to evaluation and management time, including face to face interaction, discussions on coordination of care, documentation, result review, and discussion with others as applicable.     * Signed by Heloise Purpura, M.D. on 01/11/23 at 7:54 PM (EDT)*

## 2023-02-02 NOTE — Anesthesia Preprocedure Evaluation (Signed)
Anesthesia Evaluation  Patient identified by MRN, date of birth, ID band Patient awake    Reviewed: Allergy & Precautions, NPO status , Patient's Chart, lab work & pertinent test results  History of Anesthesia Complications (+) history of anesthetic complications  Airway Mallampati: III  TM Distance: >3 FB Neck ROM: Full    Dental  (+) Dental Advisory Given, Upper Dentures   Pulmonary former smoker   Pulmonary exam normal breath sounds clear to auscultation       Cardiovascular hypertension, Pt. on medications (-) angina (-) Past MI Normal cardiovascular exam Rhythm:Regular Rate:Normal     Neuro/Psych negative neurological ROS  negative psych ROS   GI/Hepatic negative GI ROS, Neg liver ROS,,,  Endo/Other  diabetes, Type 2, Oral Hypoglycemic Agents    Renal/GU Renal InsufficiencyRenal disease   Prostate cancer     Musculoskeletal  (+) Arthritis ,    Abdominal   Peds  Hematology negative hematology ROS (+)   Anesthesia Other Findings   Reproductive/Obstetrics                             Anesthesia Physical Anesthesia Plan  ASA: 3  Anesthesia Plan: General   Post-op Pain Management: Tylenol PO (pre-op)* and Ketamine IV*   Induction: Intravenous  PONV Risk Score and Plan: 3 and Midazolam, Dexamethasone and Ondansetron  Airway Management Planned: Oral ETT  Additional Equipment:   Intra-op Plan:   Post-operative Plan: Extubation in OR  Informed Consent: I have reviewed the patients History and Physical, chart, labs and discussed the procedure including the risks, benefits and alternatives for the proposed anesthesia with the patient or authorized representative who has indicated his/her understanding and acceptance.     Dental advisory given  Plan Discussed with: CRNA  Anesthesia Plan Comments: (2nd PIV after induction )       Anesthesia Quick Evaluation

## 2023-02-03 ENCOUNTER — Encounter (HOSPITAL_COMMUNITY): Payer: Self-pay | Admitting: Urology

## 2023-02-03 ENCOUNTER — Observation Stay (HOSPITAL_COMMUNITY)
Admission: RE | Admit: 2023-02-03 | Discharge: 2023-02-04 | Disposition: A | Discharge status: Home / Self Care | Payer: Medicare Other | Attending: Urology | Admitting: Urology

## 2023-02-03 ENCOUNTER — Ambulatory Visit (HOSPITAL_BASED_OUTPATIENT_CLINIC_OR_DEPARTMENT_OTHER): Payer: Medicare Other | Admitting: Anesthesiology

## 2023-02-03 ENCOUNTER — Encounter (HOSPITAL_COMMUNITY)
Admission: RE | Disposition: A | Discharge status: Home / Self Care | Payer: Self-pay | Source: Home / Self Care | Attending: Urology

## 2023-02-03 ENCOUNTER — Ambulatory Visit (HOSPITAL_COMMUNITY): Payer: Self-pay | Admitting: Anesthesiology

## 2023-02-03 DIAGNOSIS — N189 Chronic kidney disease, unspecified: Secondary | ICD-10-CM | POA: Diagnosis not present

## 2023-02-03 DIAGNOSIS — Z87891 Personal history of nicotine dependence: Secondary | ICD-10-CM | POA: Insufficient documentation

## 2023-02-03 DIAGNOSIS — I129 Hypertensive chronic kidney disease with stage 1 through stage 4 chronic kidney disease, or unspecified chronic kidney disease: Secondary | ICD-10-CM | POA: Diagnosis not present

## 2023-02-03 DIAGNOSIS — Z79899 Other long term (current) drug therapy: Secondary | ICD-10-CM | POA: Diagnosis not present

## 2023-02-03 DIAGNOSIS — E1122 Type 2 diabetes mellitus with diabetic chronic kidney disease: Secondary | ICD-10-CM

## 2023-02-03 DIAGNOSIS — Z7984 Long term (current) use of oral hypoglycemic drugs: Secondary | ICD-10-CM | POA: Diagnosis not present

## 2023-02-03 DIAGNOSIS — E119 Type 2 diabetes mellitus without complications: Secondary | ICD-10-CM | POA: Insufficient documentation

## 2023-02-03 DIAGNOSIS — C61 Malignant neoplasm of prostate: Secondary | ICD-10-CM

## 2023-02-03 DIAGNOSIS — I10 Essential (primary) hypertension: Secondary | ICD-10-CM | POA: Diagnosis not present

## 2023-02-03 HISTORY — PX: LYMPHADENECTOMY: SHX5960

## 2023-02-03 HISTORY — PX: ROBOT ASSISTED LAPAROSCOPIC RADICAL PROSTATECTOMY: SHX5141

## 2023-02-03 LAB — GLUCOSE, CAPILLARY
Glucose-Capillary: 100 mg/dL — ABNORMAL HIGH (ref 70–99)
Glucose-Capillary: 145 mg/dL — ABNORMAL HIGH (ref 70–99)
Glucose-Capillary: 167 mg/dL — ABNORMAL HIGH (ref 70–99)
Glucose-Capillary: 181 mg/dL — ABNORMAL HIGH (ref 70–99)

## 2023-02-03 LAB — HEMOGLOBIN AND HEMATOCRIT, BLOOD
HCT: 45.2 % (ref 39.0–52.0)
Hemoglobin: 14.1 g/dL (ref 13.0–17.0)

## 2023-02-03 SURGERY — XI ROBOTIC ASSISTED LAPAROSCOPIC RADICAL PROSTATECTOMY LEVEL 2
Anesthesia: General

## 2023-02-03 MED ORDER — ACETAMINOPHEN 500 MG PO TABS
1000.0000 mg | ORAL_TABLET | Freq: Once | ORAL | Status: AC
  Administered 2023-02-03: 1000 mg via ORAL
  Filled 2023-02-03: qty 2

## 2023-02-03 MED ORDER — PROPOFOL 10 MG/ML IV BOLUS
INTRAVENOUS | Status: DC | PRN
  Administered 2023-02-03: 180 mg via INTRAVENOUS

## 2023-02-03 MED ORDER — DOCUSATE SODIUM 100 MG PO CAPS
100.0000 mg | ORAL_CAPSULE | Freq: Two times a day (BID) | ORAL | Status: DC
  Administered 2023-02-03 – 2023-02-04 (×2): 100 mg via ORAL
  Filled 2023-02-03 (×2): qty 1

## 2023-02-03 MED ORDER — KETAMINE HCL 50 MG/5ML IJ SOSY
PREFILLED_SYRINGE | INTRAMUSCULAR | Status: AC
  Filled 2023-02-03: qty 5

## 2023-02-03 MED ORDER — BUPIVACAINE-EPINEPHRINE 0.25% -1:200000 IJ SOLN
INTRAMUSCULAR | Status: DC | PRN
  Administered 2023-02-03: 50 mL

## 2023-02-03 MED ORDER — AMLODIPINE BESYLATE 10 MG PO TABS
10.0000 mg | ORAL_TABLET | Freq: Every day | ORAL | Status: DC
  Administered 2023-02-03 – 2023-02-04 (×2): 10 mg via ORAL
  Filled 2023-02-03 (×2): qty 1

## 2023-02-03 MED ORDER — GLYCOPYRROLATE 0.2 MG/ML IJ SOLN
INTRAMUSCULAR | Status: DC | PRN
  Administered 2023-02-03: .2 mg via INTRAVENOUS

## 2023-02-03 MED ORDER — HYOSCYAMINE SULFATE 0.125 MG SL SUBL: 0.1250 mg | SUBLINGUAL_TABLET | Freq: Four times a day (QID) | SUBLINGUAL | Status: DC | PRN

## 2023-02-03 MED ORDER — HEPARIN SODIUM (PORCINE) 1000 UNIT/ML IJ SOLN
INTRAMUSCULAR | Status: AC
  Filled 2023-02-03: qty 1

## 2023-02-03 MED ORDER — CEFAZOLIN SODIUM-DEXTROSE 2-4 GM/100ML-% IV SOLN
2.0000 g | INTRAVENOUS | Status: AC
  Administered 2023-02-03: 2 g via INTRAVENOUS
  Filled 2023-02-03: qty 100

## 2023-02-03 MED ORDER — DIPHENHYDRAMINE HCL 50 MG/ML IJ SOLN: 12.5000 mg | Freq: Four times a day (QID) | INTRAMUSCULAR | Status: DC | PRN

## 2023-02-03 MED ORDER — HYDRALAZINE HCL 50 MG PO TABS
100.0000 mg | ORAL_TABLET | Freq: Every day | ORAL | Status: DC
  Administered 2023-02-03 – 2023-02-04 (×2): 100 mg via ORAL
  Filled 2023-02-03 (×2): qty 2

## 2023-02-03 MED ORDER — DOCUSATE SODIUM 100 MG PO CAPS: 100.0000 mg | ORAL_CAPSULE | Freq: Two times a day (BID) | ORAL | Status: DC

## 2023-02-03 MED ORDER — INSULIN ASPART 100 UNIT/ML IJ SOLN
0.0000 [IU] | INTRAMUSCULAR | Status: DC
  Administered 2023-02-03 – 2023-02-04 (×4): 3 [IU] via SUBCUTANEOUS
  Administered 2023-02-04 (×2): 2 [IU] via SUBCUTANEOUS

## 2023-02-03 MED ORDER — PROPOFOL 10 MG/ML IV BOLUS
INTRAVENOUS | Status: AC
  Filled 2023-02-03: qty 20

## 2023-02-03 MED ORDER — MIDAZOLAM HCL 5 MG/5ML IJ SOLN
INTRAMUSCULAR | Status: DC | PRN
  Administered 2023-02-03: 2 mg via INTRAVENOUS

## 2023-02-03 MED ORDER — FLUTICASONE PROPIONATE 50 MCG/ACT NA SUSP: 1.0000 | NASAL | Status: DC | PRN

## 2023-02-03 MED ORDER — MIDAZOLAM HCL 2 MG/2ML IJ SOLN
INTRAMUSCULAR | Status: AC
  Filled 2023-02-03: qty 2

## 2023-02-03 MED ORDER — ONDANSETRON HCL 4 MG/2ML IJ SOLN: 4.0000 mg | INTRAMUSCULAR | Status: DC | PRN

## 2023-02-03 MED ORDER — OXYCODONE HCL 5 MG PO TABS: 5.0000 mg | ORAL_TABLET | Freq: Once | ORAL | Status: AC

## 2023-02-03 MED ORDER — LACTATED RINGERS IV SOLN: INTRAVENOUS | Status: DC

## 2023-02-03 MED ORDER — ONDANSETRON HCL 4 MG/2ML IJ SOLN
INTRAMUSCULAR | Status: AC
  Filled 2023-02-03: qty 2

## 2023-02-03 MED ORDER — CHLORHEXIDINE GLUCONATE 0.12 % MT SOLN
15.0000 mL | Freq: Once | OROMUCOSAL | Status: AC
  Administered 2023-02-03: 15 mL via OROMUCOSAL

## 2023-02-03 MED ORDER — FENTANYL CITRATE PF 50 MCG/ML IJ SOSY
25.0000 ug | PREFILLED_SYRINGE | INTRAMUSCULAR | Status: DC | PRN
  Administered 2023-02-03 (×3): 50 ug via INTRAVENOUS

## 2023-02-03 MED ORDER — MENTHOL 3 MG MT LOZG
1.0000 | LOZENGE | OROMUCOSAL | Status: DC | PRN
  Administered 2023-02-04: 3 mg via ORAL
  Filled 2023-02-03: qty 9

## 2023-02-03 MED ORDER — HYDRALAZINE HCL 20 MG/ML IJ SOLN
5.0000 mg | Freq: Once | INTRAMUSCULAR | Status: AC
  Administered 2023-02-03: 5 mg via INTRAVENOUS

## 2023-02-03 MED ORDER — MORPHINE SULFATE (PF) 2 MG/ML IV SOLN
2.0000 mg | INTRAVENOUS | Status: DC | PRN
  Administered 2023-02-03: 2 mg via INTRAVENOUS
  Filled 2023-02-03: qty 1

## 2023-02-03 MED ORDER — POTASSIUM CHLORIDE IN NACL 20-0.45 MEQ/L-% IV SOLN
INTRAVENOUS | Status: DC
  Filled 2023-02-03 (×4): qty 1000

## 2023-02-03 MED ORDER — OXYCODONE HCL 5 MG PO TABS
ORAL_TABLET | ORAL | Status: AC
  Administered 2023-02-03: 5 mg via ORAL
  Filled 2023-02-03: qty 1

## 2023-02-03 MED ORDER — LACTATED RINGERS IV SOLN
INTRAVENOUS | Status: DC | PRN
  Administered 2023-02-03: 1000 mL

## 2023-02-03 MED ORDER — AMISULPRIDE (ANTIEMETIC) 5 MG/2ML IV SOLN: 10.0000 mg | Freq: Once | INTRAVENOUS | Status: DC | PRN

## 2023-02-03 MED ORDER — FENTANYL CITRATE PF 50 MCG/ML IJ SOSY
PREFILLED_SYRINGE | INTRAMUSCULAR | Status: AC
  Administered 2023-02-03: 50 ug via INTRAVENOUS
  Filled 2023-02-03: qty 3

## 2023-02-03 MED ORDER — CIPROFLOXACIN HCL 500 MG PO TABS: 500.0000 mg | ORAL_TABLET | Freq: Two times a day (BID) | ORAL | 0 refills | Status: DC

## 2023-02-03 MED ORDER — HYDRALAZINE HCL 20 MG/ML IJ SOLN
INTRAMUSCULAR | Status: AC
  Filled 2023-02-03: qty 1

## 2023-02-03 MED ORDER — DIPHENHYDRAMINE HCL 12.5 MG/5ML PO ELIX: 12.5000 mg | ORAL_SOLUTION | Freq: Four times a day (QID) | ORAL | Status: DC | PRN

## 2023-02-03 MED ORDER — IRBESARTAN 300 MG PO TABS
300.0000 mg | ORAL_TABLET | Freq: Every day | ORAL | Status: DC
  Administered 2023-02-03 – 2023-02-04 (×2): 300 mg via ORAL
  Filled 2023-02-03 (×2): qty 1

## 2023-02-03 MED ORDER — TRIPLE ANTIBIOTIC 3.5-400-5000 EX OINT: 1.0000 | TOPICAL_OINTMENT | Freq: Three times a day (TID) | CUTANEOUS | Status: DC | PRN

## 2023-02-03 MED ORDER — ONDANSETRON HCL 4 MG/2ML IJ SOLN: 4.0000 mg | Freq: Once | INTRAMUSCULAR | Status: DC | PRN

## 2023-02-03 MED ORDER — KETOROLAC TROMETHAMINE 15 MG/ML IJ SOLN
15.0000 mg | Freq: Four times a day (QID) | INTRAMUSCULAR | Status: DC
  Administered 2023-02-03 – 2023-02-04 (×4): 15 mg via INTRAVENOUS
  Filled 2023-02-03 (×4): qty 1

## 2023-02-03 MED ORDER — FENTANYL CITRATE (PF) 250 MCG/5ML IJ SOLN
INTRAMUSCULAR | Status: AC
  Filled 2023-02-03: qty 5

## 2023-02-03 MED ORDER — ORAL CARE MOUTH RINSE: 15.0000 mL | Freq: Once | OROMUCOSAL | Status: AC

## 2023-02-03 MED ORDER — FENTANYL CITRATE (PF) 100 MCG/2ML IJ SOLN
INTRAMUSCULAR | Status: DC | PRN
  Administered 2023-02-03: 100 ug via INTRAVENOUS
  Administered 2023-02-03: 50 ug via INTRAVENOUS
  Administered 2023-02-03: 100 ug via INTRAVENOUS

## 2023-02-03 MED ORDER — KETAMINE HCL 10 MG/ML IJ SOLN
INTRAMUSCULAR | Status: DC | PRN
  Administered 2023-02-03: 30 mg via INTRAVENOUS

## 2023-02-03 MED ORDER — INSULIN ASPART 100 UNIT/ML IJ SOLN: 0.0000 [IU] | INTRAMUSCULAR | Status: DC | PRN

## 2023-02-03 MED ORDER — ATORVASTATIN CALCIUM 40 MG PO TABS
80.0000 mg | ORAL_TABLET | Freq: Every day | ORAL | Status: DC
  Administered 2023-02-03 – 2023-02-04 (×2): 80 mg via ORAL
  Filled 2023-02-03 (×2): qty 2

## 2023-02-03 MED ORDER — FLEET ENEMA 7-19 GM/118ML RE ENEM: 1.0000 | ENEMA | Freq: Once | RECTAL | Status: DC

## 2023-02-03 MED ORDER — MAGNESIUM CITRATE PO SOLN: 1.0000 | Freq: Once | ORAL | Status: DC

## 2023-02-03 MED ORDER — ZOLPIDEM TARTRATE 5 MG PO TABS: 5.0000 mg | ORAL_TABLET | Freq: Every evening | ORAL | Status: DC | PRN

## 2023-02-03 MED ORDER — CEFAZOLIN SODIUM-DEXTROSE 1-4 GM/50ML-% IV SOLN
1.0000 g | Freq: Three times a day (TID) | INTRAVENOUS | Status: AC
  Administered 2023-02-03 – 2023-02-04 (×2): 1 g via INTRAVENOUS
  Filled 2023-02-03 (×2): qty 50

## 2023-02-03 MED ORDER — SODIUM CHLORIDE 0.9 % IR SOLN
Status: DC | PRN
  Administered 2023-02-03: 1000 mL via INTRAVESICAL

## 2023-02-03 MED ORDER — SODIUM CHLORIDE 0.9 % IV BOLUS: 1000.0000 mL | Freq: Once | INTRAVENOUS | Status: DC

## 2023-02-03 MED ORDER — LIDOCAINE 2% (20 MG/ML) 5 ML SYRINGE
INTRAMUSCULAR | Status: DC | PRN
  Administered 2023-02-03: 80 mg via INTRAVENOUS

## 2023-02-03 MED ORDER — BUPIVACAINE-EPINEPHRINE 0.25% -1:200000 IJ SOLN
INTRAMUSCULAR | Status: AC
  Filled 2023-02-03: qty 1

## 2023-02-03 MED ORDER — ROCURONIUM BROMIDE 10 MG/ML (PF) SYRINGE
PREFILLED_SYRINGE | INTRAVENOUS | Status: DC | PRN
  Administered 2023-02-03: 20 mg via INTRAVENOUS
  Administered 2023-02-03: 80 mg via INTRAVENOUS

## 2023-02-03 MED ORDER — TRAMADOL HCL 50 MG PO TABS: 50.0000 mg | ORAL_TABLET | Freq: Four times a day (QID) | ORAL | 0 refills | Status: AC | PRN

## 2023-02-03 MED ORDER — ACETAMINOPHEN 325 MG PO TABS: 650.0000 mg | ORAL_TABLET | ORAL | Status: DC | PRN

## 2023-02-03 MED ORDER — EPHEDRINE SULFATE-NACL 50-0.9 MG/10ML-% IV SOSY
PREFILLED_SYRINGE | INTRAVENOUS | Status: DC | PRN
  Administered 2023-02-03: 10 mg via INTRAVENOUS

## 2023-02-03 MED ORDER — STERILE WATER FOR IRRIGATION IR SOLN
Status: DC | PRN
  Administered 2023-02-03: 1000 mL

## 2023-02-03 SURGICAL SUPPLY — 66 items
ADH SKN CLS APL DERMABOND .7 (GAUZE/BANDAGES/DRESSINGS) ×2
APL PRP STRL LF DISP 70% ISPRP (MISCELLANEOUS) ×2
APL SWBSTK 6 STRL LF DISP (MISCELLANEOUS) ×2
APPLICATOR COTTON TIP 6 STRL (MISCELLANEOUS) ×2 IMPLANT
APPLICATOR COTTON TIP 6IN STRL (MISCELLANEOUS) ×2
BAG COUNTER SPONGE SURGICOUNT (BAG) IMPLANT
BAG SPNG CNTER NS LX DISP (BAG)
CATH FOLEY 2WAY SLVR 18FR 30CC (CATHETERS) ×2 IMPLANT
CATH ROBINSON RED A/P 16FR (CATHETERS) ×2 IMPLANT
CATH ROBINSON RED A/P 8FR (CATHETERS) ×2 IMPLANT
CATH TIEMANN FOLEY 18FR 5CC (CATHETERS) ×2 IMPLANT
CHLORAPREP W/TINT 26 (MISCELLANEOUS) ×2 IMPLANT
CLIP LIGATING HEM O LOK PURPLE (MISCELLANEOUS) ×2 IMPLANT
COVER SURGICAL LIGHT HANDLE (MISCELLANEOUS) ×2 IMPLANT
COVER TIP SHEARS 8 DVNC (MISCELLANEOUS) ×2 IMPLANT
CUTTER ECHEON FLEX ENDO 45 340 (ENDOMECHANICALS) ×2 IMPLANT
DERMABOND ADVANCED .7 DNX12 (GAUZE/BANDAGES/DRESSINGS) ×2 IMPLANT
DRAIN CHANNEL RND F F (WOUND CARE) IMPLANT
DRAPE ARM DVNC X/XI (DISPOSABLE) ×8 IMPLANT
DRAPE COLUMN DVNC XI (DISPOSABLE) ×2 IMPLANT
DRAPE SURG IRRIG POUCH 19X23 (DRAPES) ×2 IMPLANT
DRIVER NDL LRG 8 DVNC XI (INSTRUMENTS) ×4 IMPLANT
DRIVER NDLE LRG 8 DVNC XI (INSTRUMENTS) ×4 IMPLANT
DRSG TEGADERM 4X4.75 (GAUZE/BANDAGES/DRESSINGS) ×2 IMPLANT
ELECT PENCIL ROCKER SW 15FT (MISCELLANEOUS) ×2 IMPLANT
ELECT REM PT RETURN 15FT ADLT (MISCELLANEOUS) ×2 IMPLANT
FORCEPS BPLR LNG DVNC XI (INSTRUMENTS) ×2 IMPLANT
FORCEPS PROGRASP DVNC XI (FORCEP) ×2 IMPLANT
GAUZE SPONGE 4X4 12PLY STRL (GAUZE/BANDAGES/DRESSINGS) ×2 IMPLANT
GLOVE BIO SURGEON STRL SZ 6.5 (GLOVE) ×2 IMPLANT
GLOVE SURG LX STRL 7.5 STRW (GLOVE) ×4 IMPLANT
GOWN STRL REUS W/ TWL XL LVL3 (GOWN DISPOSABLE) ×4 IMPLANT
GOWN STRL REUS W/TWL XL LVL3 (GOWN DISPOSABLE) ×4
GOWN STRL SURGICAL XL XLNG (GOWN DISPOSABLE) ×2 IMPLANT
HOLDER FOLEY CATH W/STRAP (MISCELLANEOUS) ×2 IMPLANT
IRRIG SUCT STRYKERFLOW 2 WTIP (MISCELLANEOUS) ×2
IRRIGATION SUCT STRKRFLW 2 WTP (MISCELLANEOUS) ×2 IMPLANT
IV LACTATED RINGERS 1000ML (IV SOLUTION) ×2 IMPLANT
KIT TURNOVER KIT A (KITS) IMPLANT
NDL SAFETY ECLIP 18X1.5 (MISCELLANEOUS) ×2 IMPLANT
PACK ROBOT UROLOGY CUSTOM (CUSTOM PROCEDURE TRAY) ×2 IMPLANT
PLUG CATH AND CAP STRL 200 (CATHETERS) ×2 IMPLANT
RELOAD STAPLE 45 4.1 GRN THCK (STAPLE) ×2 IMPLANT
SCISSORS MNPLR CVD DVNC XI (INSTRUMENTS) ×2 IMPLANT
SEAL UNIV 5-12 XI (MISCELLANEOUS) ×8 IMPLANT
SET CYSTO W/LG BORE CLAMP LF (SET/KITS/TRAYS/PACK) IMPLANT
SET TUBE SMOKE EVAC HIGH FLOW (TUBING) ×2 IMPLANT
SOL ELECTROSURG ANTI STICK (MISCELLANEOUS) ×2
SOL PREP POV-IOD 4OZ 10% (MISCELLANEOUS) ×2 IMPLANT
SOLUTION ELECTROSURG ANTI STCK (MISCELLANEOUS) ×2 IMPLANT
SPIKE FLUID TRANSFER (MISCELLANEOUS) ×2 IMPLANT
STAPLE RELOAD 45 GRN (STAPLE) ×2 IMPLANT
STAPLE RELOAD 45MM GREEN (STAPLE) ×2
SUT ETHILON 3 0 PS 1 (SUTURE) ×2 IMPLANT
SUT MNCRL 3 0 RB1 (SUTURE) ×2 IMPLANT
SUT MNCRL 3 0 VIOLET RB1 (SUTURE) ×2 IMPLANT
SUT MNCRL AB 4-0 PS2 18 (SUTURE) ×4 IMPLANT
SUT PDS PLUS AB 0 CT-2 (SUTURE) ×4 IMPLANT
SUT VIC AB 0 CT1 27 (SUTURE) ×4
SUT VIC AB 0 CT1 27XBRD ANTBC (SUTURE) ×4 IMPLANT
SUT VIC AB 2-0 SH 27 (SUTURE) ×4
SUT VIC AB 2-0 SH 27X BRD (SUTURE) ×2 IMPLANT
SYR 27GX1/2 1ML LL SAFETY (SYRINGE) ×2 IMPLANT
TOWEL OR NON WOVEN STRL DISP B (DISPOSABLE) ×2 IMPLANT
TROCAR Z THREAD OPTICAL 12X100 (TROCAR) IMPLANT
WATER STERILE IRR 1000ML POUR (IV SOLUTION) ×2 IMPLANT

## 2023-02-03 NOTE — Anesthesia Procedure Notes (Signed)
Procedure Name: Intubation Date/Time: 02/03/2023 11:50 AM  Performed by: Kizzie Fantasia, CRNAPre-anesthesia Checklist: Patient identified, Emergency Drugs available, Suction available, Patient being monitored and Timeout performed Patient Re-evaluated:Patient Re-evaluated prior to induction Oxygen Delivery Method: Circle system utilized Preoxygenation: Pre-oxygenation with 100% oxygen Induction Type: IV induction Ventilation: Mask ventilation without difficulty Laryngoscope Size: Mac and 4 Grade View: Grade I Tube type: Oral Tube size: 7.5 mm Number of attempts: 1 Airway Equipment and Method: Stylet Placement Confirmation: ETT inserted through vocal cords under direct vision, positive ETCO2 and breath sounds checked- equal and bilateral Secured at: 23 cm Tube secured with: Tape Dental Injury: Teeth and Oropharynx as per pre-operative assessment

## 2023-02-03 NOTE — Discharge Instructions (Signed)

## 2023-02-03 NOTE — Interval H&P Note (Signed)
History and Physical Interval Note:  02/03/2023 10:48 AM  Lawrence Macdonald  has presented today for surgery, with the diagnosis of PROSTATE CANCER.  The various methods of treatment have been discussed with the patient and family. After consideration of risks, benefits and other options for treatment, the patient has consented to  Procedure(s) with comments: XI ROBOTIC ASSISTED LAPAROSCOPIC RADICAL PROSTATECTOMY LEVEL 2 (N/A) - 210 MINUTES NEEDED FOR CASE BILATERAL PELVIC LYMPHADENECTOMY (Bilateral) as a surgical intervention.  The patient's history has been reviewed, patient examined, no change in status, stable for surgery.  I have reviewed the patient's chart and labs.  Questions were answered to the patient's satisfaction.     [employer]

## 2023-02-03 NOTE — Progress Notes (Signed)
Patient ID: Lawrence Macdonald, male   DOB: 1956-02-13, 67 y.o.   MRN: [id number]  Post-op note  Subjective: The patient is doing well.  No complaints.  Objective: Vital signs in last 24 hours: Temp:  [97.2 F (36.2 C)-98.7 F (37.1 C)] 97.2 F (36.2 C) (08/01 1441) Pulse Rate:  [67-89] 77 (08/01 1600) Resp:  [15-28] 16 (08/01 1600) BP: (146-196)/(74-99) 146/82 (08/01 1600) SpO2:  [55 %-99 %] 97 % (08/01 1600) Weight:  [108.4 kg] 108.4 kg (08/01 1008)  Intake/Output from previous day: No intake/output data recorded. Intake/Output this shift: Total I/O In: 1000 [I.V.:1000] Out: 168 [Drains:68; Blood:100]  Physical Exam:  General: Alert and oriented. Abdomen: Soft, Nondistended. Incisions: Clean and dry. GU: Urine pink and draining well.  Lab Results: No results for input(s): "HGB", "HCT" in the last 72 hours.  Assessment/Plan: POD#0   1) Continue to monitor, ambulate, IS   Moody Bruins. MD   LOS: 0 days   Crecencio Mc 02/03/2023, 4:16 PM

## 2023-02-03 NOTE — Anesthesia Postprocedure Evaluation (Signed)
Anesthesia Post Note  Patient: Alanda Slim  Procedure(s) Performed: XI ROBOTIC ASSISTED LAPAROSCOPIC RADICAL PROSTATECTOMY LEVEL 2 BILATERAL PELVIC LYMPHADENECTOMY (Bilateral)     Patient location during evaluation: PACU Anesthesia Type: General Level of consciousness: awake and alert Pain management: pain level controlled Vital Signs Assessment: post-procedure vital signs reviewed and stable Respiratory status: spontaneous breathing, nonlabored ventilation, respiratory function stable and patient connected to nasal cannula oxygen Cardiovascular status: blood pressure returned to baseline and stable Postop Assessment: no apparent nausea or vomiting Anesthetic complications: no  No notable events documented.  Last Vitals:  Vitals:   02/03/23 1600 02/03/23 1615  BP: (!) 146/82 139/76  Pulse: 77 96  Resp: 16 20  Temp:    SpO2: 97% 96%    Last Pain:  Vitals:   02/03/23 1615  TempSrc:   PainSc: 7                  Madge Therrien L Lucilla Petrenko

## 2023-02-03 NOTE — Transfer of Care (Signed)
Immediate Anesthesia Transfer of Care Note  Patient: Alanda Slim  Procedure(s) Performed: XI ROBOTIC ASSISTED LAPAROSCOPIC RADICAL PROSTATECTOMY LEVEL 2 BILATERAL PELVIC LYMPHADENECTOMY (Bilateral)  Patient Location: PACU  Anesthesia Type:General  Level of Consciousness: drowsy, patient cooperative, and responds to stimulation  Airway & Oxygen Therapy: Patient Spontanous Breathing and Patient connected to face mask oxygen  Post-op Assessment: Report given to RN and Post -op Vital signs reviewed and stable  Post vital signs: Reviewed and stable  Last Vitals:  Vitals Value Taken Time  BP 162/99 02/03/23 1440  Temp    Pulse 82 02/03/23 1445  Resp 28 02/03/23 1445  SpO2 97 % 02/03/23 1445  Vitals shown include unfiled device data.  Last Pain:  Vitals:   02/03/23 1017  TempSrc:   PainSc: 0-No pain         Complications: No notable events documented.

## 2023-02-03 NOTE — Op Note (Signed)
Preoperative diagnosis: Clinically localized adenocarcinoma of the prostate (clinical stage T1c N0 M0)  Postoperative diagnosis: Clinically localized adenocarcinoma of the prostate (clinical stage T1c N0 M0)  Procedure:  Robotic assisted laparoscopic radical prostatectomy (non nerve sparing) Bilateral robotic assisted laparoscopic pelvic lymphadenectomy  Surgeon: Moody Bruins. M.D.  Assistant(s): Harrie Foreman, PA-C  Anesthesia: General  Complications: None  EBL: 100 mL  IVF:  1000 mL crystalloid  Specimens: Prostate and seminal vesicles Right pelvic lymph nodes Left pelvic lymph nodes  Disposition of specimens: Pathology  Drains: 20 Fr coude catheter # 19 Blake pelvic drain  Indication: Lawrence Macdonald is a 67 y.o. year old patient with clinically localized prostate cancer.  After a thorough review of the management options for treatment of prostate cancer, he elected to proceed with surgical therapy and the above procedure(s).  We have discussed the potential benefits and risks of the procedure, side effects of the proposed treatment, the likelihood of the patient achieving the goals of the procedure, and any potential problems that might occur during the procedure or recuperation. Informed consent has been obtained.  Description of procedure:  The patient was taken to the operating room and a general anesthetic was administered. He was given preoperative antibiotics, placed in the dorsal lithotomy position, and prepped and draped in the usual sterile fashion. Next a preoperative timeout was performed. A urethral catheter was placed into the bladder and a site was selected near the umbilicus for placement of the camera port. This was placed using a standard open Hassan technique which allowed entry into the peritoneal cavity under direct vision and without difficulty. A 12 mm port was placed and a pneumoperitoneum established. The camera was then used to inspect the  abdomen and there was no evidence of any intra-abdominal injuries or other abnormalities. The remaining abdominal ports were then placed. 8 mm robotic ports were placed in the right lower quadrant, left lower quadrant, and far left lateral abdominal wall. A 5 mm port was placed in the right upper quadrant and a 12 mm port was placed in the right lateral abdominal wall for laparoscopic assistance. All ports were placed under direct vision without difficulty. The surgical cart was then docked.   Utilizing the cautery scissors, the bladder was reflected posteriorly allowing entry into the space of Retzius and identification of the endopelvic fascia and prostate. The periprostatic fat was then removed from the prostate allowing full exposure of the endopelvic fascia. The endopelvic fascia was then incised from the apex back to the base of the prostate bilaterally and the underlying levator muscle fibers were swept laterally off the prostate thereby isolating the dorsal venous complex. The dorsal vein was then stapled and divided with a 45 mm Flex Echelon stapler. Attention then turned to the bladder neck which was divided anteriorly thereby allowing entry into the bladder and exposure of the urethral catheter. The catheter balloon was deflated and the catheter was brought into the operative field and used to retract the prostate anteriorly. The posterior bladder neck was then examined and was divided allowing further dissection between the bladder and prostate posteriorly until the vasa deferentia and seminal vessels were identified. The vasa deferentia were isolated, divided, and lifted anteriorly. The seminal vesicles were dissected down to their tips with care to control the seminal vascular arterial blood supply. These structures were then lifted anteriorly and the space between Denonvillier's fascia and the anterior rectum was developed with a combination of sharp and blunt dissection. This isolated  the vascular  pedicles of the prostate.  A wide non nerve sparing dissection was performed with Weck clips used to ligate the vascular pedicles of the prostate bilaterally. The vascular pedicles of the prostate were then divided.  The urethra was then sharply transected allowing the prostate specimen to be disarticulated. The pelvis was copiously irrigated and hemostasis was ensured. There was no evidence for rectal injury.  Attention then turned to the right pelvic sidewall. The fibrofatty tissue between the external iliac vein, confluence of the iliac vessels, hypogastric artery, and Cooper's ligament was dissected free from the pelvic sidewall with care to preserve the obturator nerve. Weck clips were used for lymphostasis and hemostasis. An identical procedure was performed on the contralateral side and the lymphatic packets were removed for permanent pathologic analysis.  Attention then turned to the urethral anastomosis. A 2-0 Vicryl slip knot was placed between Denonvillier's fascia, the posterior bladder neck, and the posterior urethra to reapproximate these structures. A double-armed 3-0 Monocryl suture was then used to perform a 360 running tension-free anastomosis between the bladder neck and urethra. A new urethral catheter was then placed into the bladder and irrigated. There were no blood clots within the bladder and the anastomosis appeared to be watertight. A #19 Blake drain was then brought through the left lateral 8 mm port site and positioned appropriately within the pelvis. It was secured to the skin with a nylon suture. The surgical cart was then undocked. The right lateral 12 mm port site was closed at the fascial level with a 0 Vicryl suture placed laparoscopically. All remaining ports were then removed under direct vision. The prostate specimen was removed intact within the Endopouch retrieval bag via the periumbilical camera port site. This fascial opening was closed with two running 0 Vicryl  sutures. 0.25% Marcaine was then injected into all port sites and all incisions were reapproximated at the skin level with 3-0 Monocryl subcuticular sutures. Dermabond was applied to the skin. The patient appeared to tolerate the procedure well and without complications. The patient was able to be extubated and transferred to the recovery unit in satisfactory condition.  Moody Bruins MD

## 2023-02-04 ENCOUNTER — Encounter (HOSPITAL_COMMUNITY): Payer: Self-pay | Admitting: Urology

## 2023-02-04 DIAGNOSIS — C61 Malignant neoplasm of prostate: Secondary | ICD-10-CM | POA: Diagnosis not present

## 2023-02-04 LAB — GLUCOSE, CAPILLARY
Glucose-Capillary: 137 mg/dL — ABNORMAL HIGH (ref 70–99)
Glucose-Capillary: 145 mg/dL — ABNORMAL HIGH (ref 70–99)
Glucose-Capillary: 170 mg/dL — ABNORMAL HIGH (ref 70–99)
Glucose-Capillary: 170 mg/dL — ABNORMAL HIGH (ref 70–99)

## 2023-02-04 MED ORDER — TRAMADOL HCL 50 MG PO TABS
50.0000 mg | ORAL_TABLET | Freq: Four times a day (QID) | ORAL | Status: DC | PRN
  Administered 2023-02-04: 50 mg via ORAL
  Filled 2023-02-04: qty 1

## 2023-02-04 MED ORDER — BISACODYL 10 MG RE SUPP
10.0000 mg | Freq: Once | RECTAL | Status: AC
  Administered 2023-02-04: 10 mg via RECTAL
  Filled 2023-02-04: qty 1

## 2023-02-04 NOTE — Plan of Care (Signed)
  Problem: Education: Goal: Ability to describe self-care measures that may prevent or decrease complications (Diabetes Survival Skills Education) will improve Outcome: Adequate for Discharge Goal: Individualized Educational Video(s) Outcome: Adequate for Discharge   Problem: Coping: Goal: Ability to adjust to condition or change in health will improve Outcome: Adequate for Discharge   Problem: Fluid Volume: Goal: Ability to maintain a balanced intake and output will improve Outcome: Adequate for Discharge   Problem: Health Behavior/Discharge Planning: Goal: Ability to identify and utilize available resources and services will improve Outcome: Adequate for Discharge Goal: Ability to manage health-related needs will improve Outcome: Adequate for Discharge   Problem: Metabolic: Goal: Ability to maintain appropriate glucose levels will improve Outcome: Adequate for Discharge   Problem: Nutritional: Goal: Maintenance of adequate nutrition will improve Outcome: Adequate for Discharge Goal: Progress toward achieving an optimal weight will improve Outcome: Adequate for Discharge   Problem: Skin Integrity: Goal: Risk for impaired skin integrity will decrease Outcome: Adequate for Discharge   Problem: Tissue Perfusion: Goal: Adequacy of tissue perfusion will improve Outcome: Adequate for Discharge   Problem: Education: Goal: Knowledge of the procedure and recovery process will improve Outcome: Adequate for Discharge   Problem: Bowel/Gastric: Goal: Gastrointestinal status for postoperative course will improve Outcome: Adequate for Discharge   Problem: Pain Management: Goal: General experience of comfort will improve Outcome: Adequate for Discharge   Problem: Skin Integrity: Goal: Demonstration of wound healing without infection will improve Outcome: Adequate for Discharge   Problem: Urinary Elimination: Goal: Ability to avoid or minimize complications of infection will  improve Outcome: Adequate for Discharge Goal: Ability to achieve and maintain urine output will improve Outcome: Adequate for Discharge Goal: Home care management will improve Outcome: Adequate for Discharge   Problem: Education: Goal: Knowledge of General Education information will improve Description: Including pain rating scale, medication(s)/side effects and non-pharmacologic comfort measures 02/04/2023 1354 by Val Eagle, RN Outcome: Adequate for Discharge 02/04/2023 1353 by Val Eagle, RN Outcome: Progressing   Problem: Health Behavior/Discharge Planning: Goal: Ability to manage health-related needs will improve 02/04/2023 1354 by Val Eagle, RN Outcome: Adequate for Discharge 02/04/2023 1353 by Val Eagle, RN Outcome: Progressing   Problem: Clinical Measurements: Goal: Ability to maintain clinical measurements within normal limits will improve Outcome: Adequate for Discharge Goal: Will remain free from infection Outcome: Adequate for Discharge Goal: Diagnostic test results will improve Outcome: Adequate for Discharge Goal: Respiratory complications will improve Outcome: Adequate for Discharge Goal: Cardiovascular complication will be avoided Outcome: Adequate for Discharge   Problem: Activity: Goal: Risk for activity intolerance will decrease Outcome: Adequate for Discharge   Problem: Nutrition: Goal: Adequate nutrition will be maintained Outcome: Adequate for Discharge   Problem: Coping: Goal: Level of anxiety will decrease Outcome: Adequate for Discharge   Problem: Elimination: Goal: Will not experience complications related to bowel motility Outcome: Adequate for Discharge Goal: Will not experience complications related to urinary retention Outcome: Adequate for Discharge   Problem: Pain Managment: Goal: General experience of comfort will improve Outcome: Adequate for Discharge   Problem: Safety: Goal: Ability to remain free from  injury will improve Outcome: Adequate for Discharge   Problem: Skin Integrity: Goal: Risk for impaired skin integrity will decrease Outcome: Adequate for Discharge

## 2023-02-04 NOTE — Plan of Care (Signed)
Problem: Education: Goal: Ability to describe self-care measures that may prevent or decrease complications (Diabetes Survival Skills Education) will improve 02/04/2023 1355 by Val Eagle, RN Outcome: Adequate for Discharge 02/04/2023 1354 by Val Eagle, RN Outcome: Adequate for Discharge Goal: Individualized Educational Video(s) 02/04/2023 1355 by Val Eagle, RN Outcome: Adequate for Discharge 02/04/2023 1354 by Val Eagle, RN Outcome: Adequate for Discharge   Problem: Coping: Goal: Ability to adjust to condition or change in health will improve 02/04/2023 1355 by Val Eagle, RN Outcome: Adequate for Discharge 02/04/2023 1354 by Val Eagle, RN Outcome: Adequate for Discharge   Problem: Fluid Volume: Goal: Ability to maintain a balanced intake and output will improve 02/04/2023 1355 by Val Eagle, RN Outcome: Adequate for Discharge 02/04/2023 1354 by Val Eagle, RN Outcome: Adequate for Discharge   Problem: Health Behavior/Discharge Planning: Goal: Ability to identify and utilize available resources and services will improve 02/04/2023 1355 by Val Eagle, RN Outcome: Adequate for Discharge 02/04/2023 1354 by Val Eagle, RN Outcome: Adequate for Discharge Goal: Ability to manage health-related needs will improve 02/04/2023 1355 by Val Eagle, RN Outcome: Adequate for Discharge 02/04/2023 1354 by Val Eagle, RN Outcome: Adequate for Discharge   Problem: Metabolic: Goal: Ability to maintain appropriate glucose levels will improve 02/04/2023 1355 by Val Eagle, RN Outcome: Adequate for Discharge 02/04/2023 1354 by Val Eagle, RN Outcome: Adequate for Discharge   Problem: Nutritional: Goal: Maintenance of adequate nutrition will improve 02/04/2023 1355 by Val Eagle, RN Outcome: Adequate for Discharge 02/04/2023 1354 by Val Eagle, RN Outcome: Adequate for Discharge Goal: Progress toward achieving an  optimal weight will improve 02/04/2023 1355 by Val Eagle, RN Outcome: Adequate for Discharge 02/04/2023 1354 by Val Eagle, RN Outcome: Adequate for Discharge   Problem: Skin Integrity: Goal: Risk for impaired skin integrity will decrease 02/04/2023 1355 by Val Eagle, RN Outcome: Adequate for Discharge 02/04/2023 1354 by Val Eagle, RN Outcome: Adequate for Discharge   Problem: Tissue Perfusion: Goal: Adequacy of tissue perfusion will improve 02/04/2023 1355 by Val Eagle, RN Outcome: Adequate for Discharge 02/04/2023 1354 by Val Eagle, RN Outcome: Adequate for Discharge   Problem: Education: Goal: Knowledge of the procedure and recovery process will improve 02/04/2023 1355 by Val Eagle, RN Outcome: Adequate for Discharge 02/04/2023 1354 by Val Eagle, RN Outcome: Adequate for Discharge   Problem: Bowel/Gastric: Goal: Gastrointestinal status for postoperative course will improve 02/04/2023 1355 by Val Eagle, RN Outcome: Adequate for Discharge 02/04/2023 1354 by Val Eagle, RN Outcome: Adequate for Discharge   Problem: Pain Management: Goal: General experience of comfort will improve 02/04/2023 1355 by Val Eagle, RN Outcome: Adequate for Discharge 02/04/2023 1354 by Val Eagle, RN Outcome: Adequate for Discharge   Problem: Skin Integrity: Goal: Demonstration of wound healing without infection will improve 02/04/2023 1355 by Val Eagle, RN Outcome: Adequate for Discharge 02/04/2023 1354 by Val Eagle, RN Outcome: Adequate for Discharge   Problem: Urinary Elimination: Goal: Ability to avoid or minimize complications of infection will improve 02/04/2023 1355 by Val Eagle, RN Outcome: Adequate for Discharge 02/04/2023 1354 by Val Eagle, RN Outcome: Adequate for Discharge Goal: Ability to achieve and maintain urine output will improve 02/04/2023 1355 by Val Eagle, RN Outcome: Adequate for  Discharge 02/04/2023 1354 by Val Eagle, RN Outcome: Adequate for Discharge Goal: Home care management will improve  02/04/2023 1355 by Val Eagle, RN Outcome: Adequate for Discharge 02/04/2023 1354 by Val Eagle, RN Outcome: Adequate for Discharge   Problem: Education: Goal: Knowledge of General Education information will improve Description: Including pain rating scale, medication(s)/side effects and non-pharmacologic comfort measures 02/04/2023 1355 by Val Eagle, RN Outcome: Adequate for Discharge 02/04/2023 1354 by Val Eagle, RN Outcome: Adequate for Discharge 02/04/2023 1353 by Val Eagle, RN Outcome: Progressing   Problem: Health Behavior/Discharge Planning: Goal: Ability to manage health-related needs will improve 02/04/2023 1355 by Val Eagle, RN Outcome: Adequate for Discharge 02/04/2023 1354 by Val Eagle, RN Outcome: Adequate for Discharge 02/04/2023 1353 by Val Eagle, RN Outcome: Progressing   Problem: Clinical Measurements: Goal: Ability to maintain clinical measurements within normal limits will improve 02/04/2023 1355 by Val Eagle, RN Outcome: Adequate for Discharge 02/04/2023 1354 by Val Eagle, RN Outcome: Adequate for Discharge Goal: Will remain free from infection 02/04/2023 1355 by Val Eagle, RN Outcome: Adequate for Discharge 02/04/2023 1354 by Val Eagle, RN Outcome: Adequate for Discharge Goal: Diagnostic test results will improve 02/04/2023 1355 by Val Eagle, RN Outcome: Adequate for Discharge 02/04/2023 1354 by Val Eagle, RN Outcome: Adequate for Discharge Goal: Respiratory complications will improve 02/04/2023 1355 by Val Eagle, RN Outcome: Adequate for Discharge 02/04/2023 1354 by Val Eagle, RN Outcome: Adequate for Discharge Goal: Cardiovascular complication will be avoided 02/04/2023 1355 by Val Eagle, RN Outcome: Adequate for Discharge 02/04/2023 1354 by  Val Eagle, RN Outcome: Adequate for Discharge   Problem: Activity: Goal: Risk for activity intolerance will decrease 02/04/2023 1355 by Val Eagle, RN Outcome: Adequate for Discharge 02/04/2023 1354 by Val Eagle, RN Outcome: Adequate for Discharge   Problem: Nutrition: Goal: Adequate nutrition will be maintained 02/04/2023 1355 by Val Eagle, RN Outcome: Adequate for Discharge 02/04/2023 1354 by Val Eagle, RN Outcome: Adequate for Discharge   Problem: Coping: Goal: Level of anxiety will decrease 02/04/2023 1355 by Val Eagle, RN Outcome: Adequate for Discharge 02/04/2023 1354 by Val Eagle, RN Outcome: Adequate for Discharge   Problem: Elimination: Goal: Will not experience complications related to bowel motility 02/04/2023 1355 by Val Eagle, RN Outcome: Adequate for Discharge 02/04/2023 1354 by Val Eagle, RN Outcome: Adequate for Discharge Goal: Will not experience complications related to urinary retention 02/04/2023 1355 by Val Eagle, RN Outcome: Adequate for Discharge 02/04/2023 1354 by Val Eagle, RN Outcome: Adequate for Discharge   Problem: Pain Managment: Goal: General experience of comfort will improve 02/04/2023 1355 by Val Eagle, RN Outcome: Adequate for Discharge 02/04/2023 1354 by Val Eagle, RN Outcome: Adequate for Discharge   Problem: Safety: Goal: Ability to remain free from injury will improve 02/04/2023 1355 by Val Eagle, RN Outcome: Adequate for Discharge 02/04/2023 1354 by Val Eagle, RN Outcome: Adequate for Discharge   Problem: Skin Integrity: Goal: Risk for impaired skin integrity will decrease 02/04/2023 1355 by Val Eagle, RN Outcome: Adequate for Discharge 02/04/2023 1354 by Val Eagle, RN Outcome: Adequate for Discharge

## 2023-02-04 NOTE — Progress Notes (Signed)
Patient ID: Lawrence Macdonald, male   DOB: [birth year], 67 y.o.   MRN: [id number]  1 Day Post-Op Subjective: The patient is doing well.  No nausea or vomiting. Pain is adequately controlled.  Objective: Vital signs in last 24 hours: Temp:  [97.2 F (36.2 C)-98.7 F (37.1 C)] 98.2 F (36.8 C) (08/02 0412) Pulse Rate:  [63-96] 63 (08/02 0412) Resp:  [15-28] 20 (08/02 0412) BP: (128-196)/(72-99) 128/79 (08/02 0412) SpO2:  [55 %-100 %] 100 % (08/02 0412) Weight:  [108.4 kg] 108.4 kg (08/01 1008)  Intake/Output from previous day: 08/01 0701 - 08/02 0700 In: 2877.1 [P.O.:60; I.V.:2817.1] Out: 1880 [Urine:1525; Drains:200; Blood:100] Intake/Output this shift: No intake/output data recorded.  Physical Exam:  General: Alert and oriented. CV: RRR Lungs: Clear bilaterally. GI: Soft, Nondistended. Incisions: Clean, dry, and intact Urine: Clear Extremities: Nontender, no erythema, no edema.  Lab Results: Recent Labs    02/03/23 1616 02/04/23 0351  HGB 14.1 12.8*  HCT 45.2 39.4      Assessment/Plan: POD# 1 s/p robotic prostatectomy.  1) SL IVF 2) Ambulate, Incentive spirometry 3) Transition to oral pain medication 4) Dulcolax suppository 5) D/C pelvic drain 6) Plan for likely discharge later today   Moody Bruins. MD   LOS: 0 days   Crecencio Mc 02/04/2023, 7:24 AM

## 2023-02-04 NOTE — Care Management Obs Status (Signed)
MEDICARE OBSERVATION STATUS NOTIFICATION   Patient Details  Name: DAYN BARICH MRN: [id number] Date of Birth: March 20, 1956   Medicare Observation Status Notification Given:       Howell Rucks, RN 02/04/2023, 10:59 AM

## 2023-02-04 NOTE — TOC CM/SW Note (Signed)
Transition of Care [facility]) - Inpatient Brief Assessment   Patient Details  Name: ZACHERIAH STUMPE MRN: [id number] Date of Birth: 04-Sep-1955  [facility]) CM/SW Contact:    Howell Rucks, RN Phone Number: 02/04/2023, 11:02 AM   Clinical Narrative: Met with pt at bedside to introduce role of TOC/NCM and review for dc planning  Pt reports he has a PCP and pharmacy in place, no current home care services, reports he has a walker at home he uses as needed, reports he feels safe returning home and his spouse will provide transportation at discharge. MOON explained, form signed, copy given to pt.  TOC Brief Assessment completed. No TOC needs identified.     Transition of Care Asessment: Insurance and Status: Insurance coverage has been reviewed Patient has primary care physician: Yes Home environment has been reviewed: private residence with spouse Prior level of function:: Independent Prior/Current Home Services: No current home services Social Determinants of Health Reivew: SDOH reviewed no interventions necessary Readmission risk has been reviewed: Yes Transition of care needs: no transition of care needs at this time

## 2023-02-04 NOTE — Plan of Care (Signed)
  Problem: Education: Goal: Knowledge of General Education information will improve Description Including pain rating scale, medication(s)/side effects and non-pharmacologic comfort measures Outcome: Progressing   Problem: Health Behavior/Discharge Planning: Goal: Ability to manage health-related needs will improve Outcome: Progressing   

## 2023-02-04 NOTE — Discharge Summary (Signed)
Date of admission: 02/03/2023  Date of discharge: 02/04/2023  Admission diagnosis: Prostate Cancer  Discharge diagnosis: Prostate Cancer  History and Physical: For full details, please see admission history and physical. Briefly, Lawrence Macdonald is a 67 y.o. gentleman with localized prostate cancer.  After discussing management/treatment options, he elected to proceed with surgical treatment.  Hospital Course: Lawrence Macdonald was taken to the operating room on 02/03/2023 and underwent a robotic assisted laparoscopic radical prostatectomy. He tolerated this procedure well and without complications. Postoperatively, he was able to be transferred to a regular hospital room following recovery from anesthesia.  He was able to begin ambulating the night of surgery. He remained hemodynamically stable overnight.  He had excellent urine output with appropriately minimal output from his pelvic drain and his pelvic drain was removed on POD #1.  He was transitioned to oral pain medication, tolerated a clear liquid diet, and had met all discharge criteria and was able to be discharged home later on POD#1.  Laboratory values:  Recent Labs    02/03/23 1616 02/04/23 0351  HGB 14.1 12.8*  HCT 45.2 39.4    Disposition: Home  Discharge instruction: He was instructed to be ambulatory but to refrain from heavy lifting, strenuous activity, or driving. He was instructed on urethral catheter care.  Discharge medications:   Allergies as of 02/04/2023       Reactions   Hydrochlorothiazide    Caused patient to have gout   Lisinopril Cough   Sulfadiazine Rash        Medication List     TAKE these medications    acetaminophen 500 MG tablet Commonly known as: TYLENOL You can take 1000 mg of Tylenol/acetaminophen every 6 hours as needed for pain.  Do not exceed 4000 mg of Tylenol per day it can harm your liver.  You can buy this over-the-counter at any drugstore.  You can use naproxen in addition to plain  Tylenol for pain.   amLODipine 10 MG tablet Commonly known as: NORVASC Take 10 mg by mouth daily.   atorvastatin 80 MG tablet Commonly known as: LIPITOR Take 80 mg by mouth daily.   ciprofloxacin 500 MG tablet Commonly known as: Cipro Take 1 tablet (500 mg total) by mouth 2 (two) times daily. Start day prior to office visit for foley removal   docusate sodium 100 MG capsule Commonly known as: COLACE Take 1 capsule (100 mg total) by mouth 2 (two) times daily. What changed:  when to take this reasons to take this   docusate sodium 100 MG capsule Commonly known as: COLACE Take 1 capsule (100 mg total) by mouth 2 (two) times daily. What changed: You were already taking a medication with the same name, and this prescription was added. Make sure you understand how and when to take each.   fluticasone 50 MCG/ACT nasal spray Commonly known as: FLONASE Place 1 spray into both nostrils as needed for allergies or rhinitis.   hydrALAZINE 100 MG tablet Commonly known as: APRESOLINE Take 100 mg by mouth daily.   metFORMIN 500 MG 24 hr tablet Commonly known as: GLUCOPHAGE-XR Take 500 mg by mouth daily.   traMADol 50 MG tablet Commonly known as: Ultram Take 1-2 tablets (50-100 mg total) by mouth every 6 (six) hours as needed for moderate pain or severe pain.   valsartan 320 MG tablet Commonly known as: DIOVAN Take 320 mg by mouth daily.   VISINE OP Place 1 drop into both eyes daily as needed (irritation).  Followup: He will followup in 1 week for catheter removal and to discuss his surgical pathology results.

## 2023-02-07 ENCOUNTER — Encounter: Payer: Self-pay | Admitting: Genetic Counselor

## 2023-02-07 ENCOUNTER — Ambulatory Visit: Payer: Self-pay | Admitting: Genetic Counselor

## 2023-02-07 DIAGNOSIS — Z1379 Encounter for other screening for genetic and chromosomal anomalies: Secondary | ICD-10-CM

## 2023-02-07 NOTE — Progress Notes (Signed)
HPI:   Mr. Picazo was previously seen in the [facility] due to a personal and family history of cancer and concerns regarding a hereditary predisposition to cancer. Please refer to our prior cancer genetics clinic note for more information regarding our discussion, assessment and recommendations, at the time. Mr. Hinsdale recent genetic test results were disclosed to him, as were recommendations warranted by these results. These results and recommendations are discussed in more detail below.  CANCER HISTORY:  Oncology History  Malignant neoplasm of prostate (HCC)  11/12/2022 Cancer Staging   Staging form: Prostate, AJCC 8th Edition - Clinical stage from 11/12/2022: Stage IIIA (cT1c, cN0, cM0, PSA: 30, Grade Group: 2) - Signed by Marcello Fennel, PA-C on 01/11/2023 Histopathologic type: Adenocarcinoma, NOS Stage prefix: Initial diagnosis Prostate specific antigen (PSA) range: 20 or greater Gleason primary pattern: 3 Gleason secondary pattern: 4 Gleason score: 7 Histologic grading system: 5 grade system Number of biopsy cores examined: 10 Number of biopsy cores positive: 12 Location of positive needle core biopsies: Both sides   01/11/2023 Initial Diagnosis   Malignant neoplasm of prostate (HCC)     FAMILY HISTORY:  We obtained a detailed, 4-generation family history.  Significant diagnoses are listed below:      Family History  Problem Relation Age of Onset   Breast cancer Mother 53 - 57   Prostate cancer Brother 58 - 42        paternal half-brother   Breast cancer Maternal Aunt 55   Prostate cancer Maternal Uncle     Prostate cancer Cousin          2 maternal first cousins               Mr. Kirch mother was diagnosed with breast cancer in her 61s, she died at age 66. One maternal aunt was diagnosed with breast cancer at age 28 and one maternal uncle was diagnosed with prostate cancer at an unknown age. He has 2 maternal first cousins who have a  history of prostate cancer. Mr. Eurich paternal half-brother was diagnosed with prostate cancer in his 37s. Mr. Burge is unaware of previous family history of genetic testing for hereditary cancer risks. There is no reported Ashkenazi Jewish ancestry.    GENETIC TEST RESULTS:  The [facility] Custom Panel found no pathogenic mutations.   The Custom Hereditary Cancers Panel offered by [facility] includes sequencing and/or deletion duplication testing of the following 43 genes: APC, ATM, AXIN2, BAP1, BARD1, BMPR1A, BRCA1, BRCA2, BRIP1, CDH1, CDK4, CDKN2A (p14ARF and p16INK4a only), CHEK2, CTNNA1, EPCAM (Deletion/duplication testing only), FH, GREM1 (promoter region duplication testing only), HOXB13, KIT, MBD4, MEN1, MLH1, MSH2, MSH3, MSH6, MUTYH, NF1, NHTL1, PALB2, PDGFRA, PMS2, POLD1, POLE, PTEN, RAD51C, RAD51D, SMAD4, SMARCA4. STK11, TP53, TSC1, TSC2, and VHL.  The test report has been scanned into EPIC and is located under the Molecular Pathology section of the Results Review tab.  A portion of the result report is included below for reference. Genetic testing reported out on 01/25/2023.      Even though a pathogenic variant was not identified, possible explanations for the cancer in the family may include: There may be no hereditary risk for cancer in the family. The cancers in Mr. Schnider and/or his family may be due to other genetic or environmental factors. There may be a gene mutation in one of these genes that current testing methods cannot detect, but that chance is small. There could be another gene that has not yet been  discovered, or that we have not yet tested, that is responsible for the cancer diagnoses in the family.  It is also possible there is a hereditary cause for the cancer in the family that Mr. Gottsch did not inherit.  Therefore, it is important to remain in touch with cancer genetics in the future so that we can continue to offer Mr. Ganus the most up to date genetic  testing.   ADDITIONAL GENETIC TESTING:  We discussed with Mr. Czaplewski that his genetic testing was fairly extensive.  If there are genes identified to increase cancer risk that can be analyzed in the future, we would be happy to discuss and coordinate this testing at that time.    CANCER SCREENING RECOMMENDATIONS:  Mr. Friedlander test result is considered negative (normal).  This means that we have not identified a hereditary cause for his personal and family history of cancer at this time.   An individual's cancer risk and medical management are not determined by genetic test results alone. Overall cancer risk assessment incorporates additional factors, including personal medical history, family history, and any available genetic information that may result in a personalized plan for cancer prevention and surveillance. Therefore, it is recommended he continue to follow the cancer management and screening guidelines provided by his oncology and primary healthcare provider.  RECOMMENDATIONS FOR FAMILY MEMBERS:   Since he did not inherit a mutation in a cancer predisposition gene included on this panel, his son could not have inherited a mutation from him in one of these genes. Other members of the family may still carry a pathogenic variant in one of these genes that Mr. Pinchback did not inherit. Based on the family history, we recommend his siblings have genetic counseling and testing.   FOLLOW-UP:  Cancer genetics is a rapidly advancing field and it is possible that new genetic tests will be appropriate for him and/or his family members in the future. We encouraged him to remain in contact with cancer genetics on an annual basis so we can update his personal and family histories and let him know of advances in cancer genetics that may benefit this family.   Our contact number was provided. Mr. Scroggin questions were answered to his satisfaction, and he knows he is welcome to call us at anytime with  additional questions or concerns.   Lalla Brothers, MS, [facility] Genetic Counselor [email or web address] (P) [phone]

## 2023-02-11 NOTE — Progress Notes (Signed)
Patient proceed with robotic prostatectomy on 8/1 and had completed his post op check with urology on 8/6.   RN called patient, voicemail not set up at this time.  Pt continues under care of urology, and will have his first post operative PSA in a few months.   Will continue to follow to ensure coordination of care.

## 2023-03-08 ENCOUNTER — Encounter: Payer: Self-pay | Admitting: *Deleted

## 2023-03-15 ENCOUNTER — Inpatient Hospital Stay: Payer: Medicare Other | Attending: Radiation Oncology | Admitting: *Deleted

## 2023-03-15 ENCOUNTER — Encounter: Payer: Self-pay | Admitting: *Deleted

## 2023-03-15 DIAGNOSIS — C61 Malignant neoplasm of prostate: Secondary | ICD-10-CM

## 2023-03-15 NOTE — Progress Notes (Signed)
SCP reviewed and completed. Pt will have PSA labs in October at [facility].

## 2023-05-15 IMAGING — CT CT ANGIO CHEST
2 of 6 series · 17 of 46 positions shown · IV contrast (agent unspecified)
Comparison: Chest radiograph dated 09/28/2021.

CLINICAL DATA: Headache, diarrhea, cough. Abnormal chest
radiograph.



[Series 3: thins · axial · 0.86mm/px · z∈[-376,-54]mm · 14 of 354 slices shown]
[im 16/354  lung]
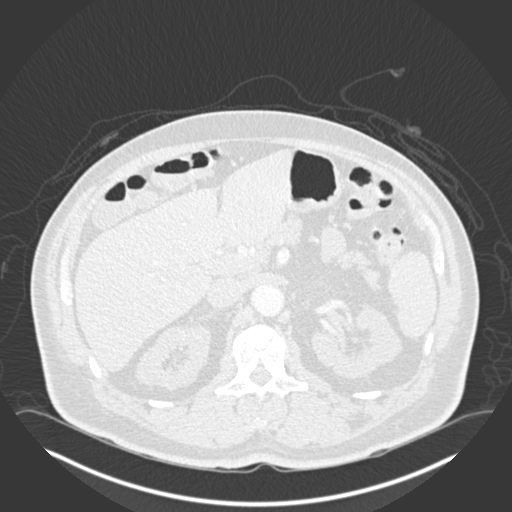
[im 47/354  soft-tissue]
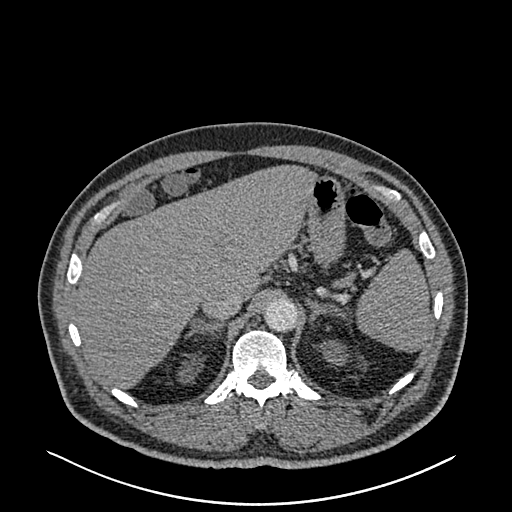
[im 62/354  lung]
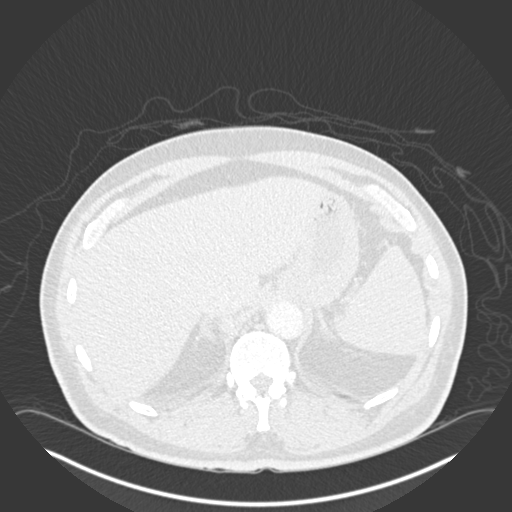
[im 93/354  soft-tissue]
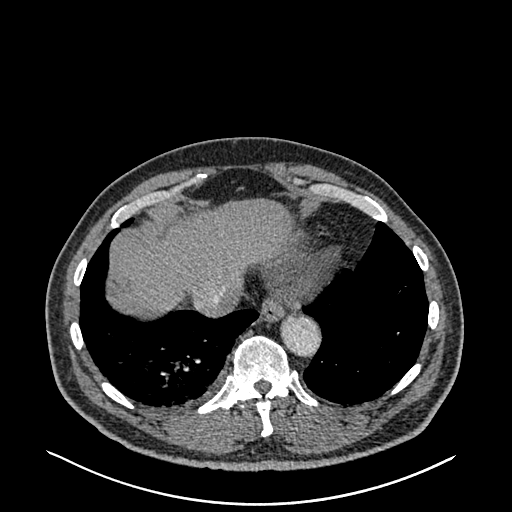
[im 123/354  lung]
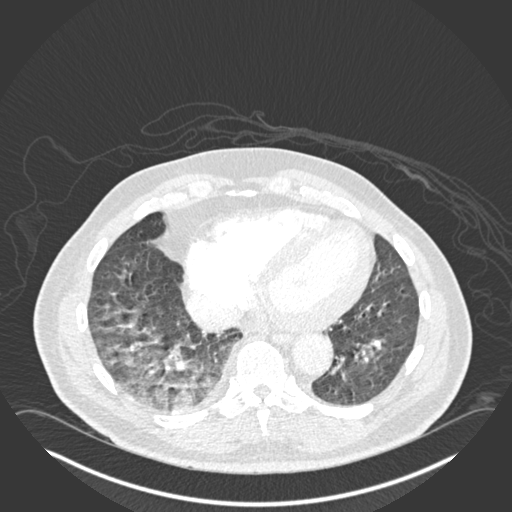
[im 139/354  soft-tissue]
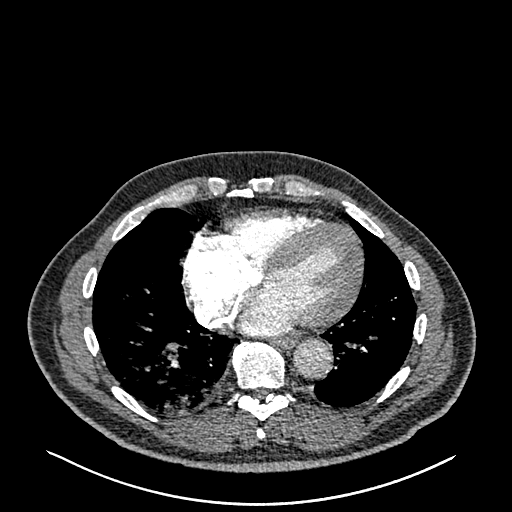
[im 169/354  lung]
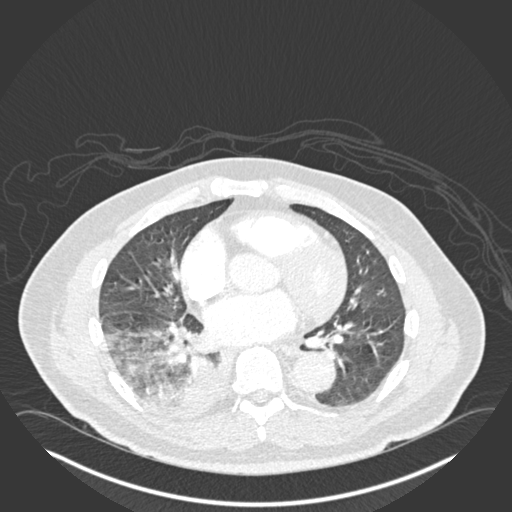
[im 185/354  soft-tissue]
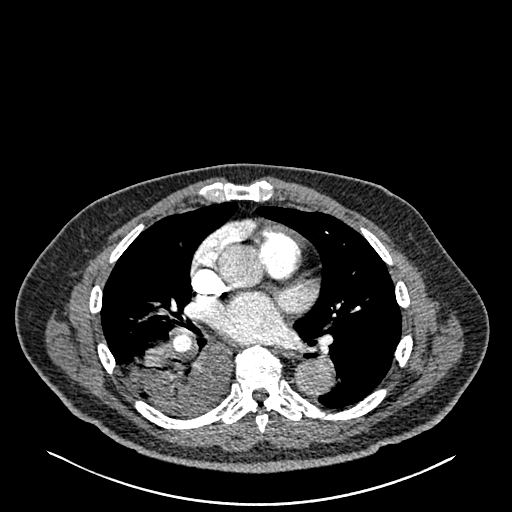
[im 215/354  lung]
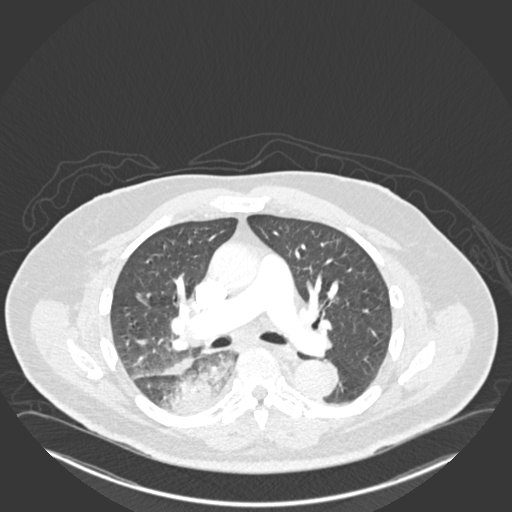
[im 231/354  soft-tissue]
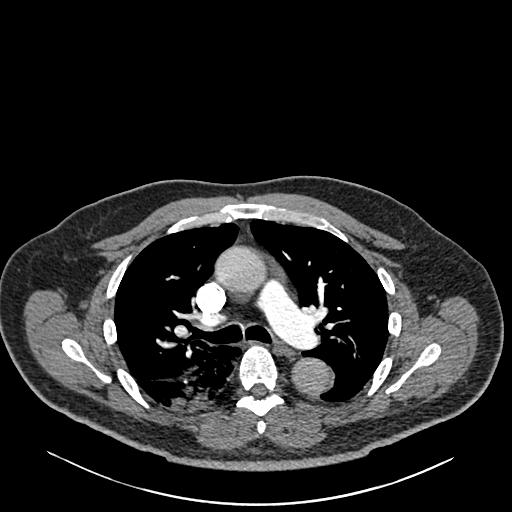
[im 261/354  lung]
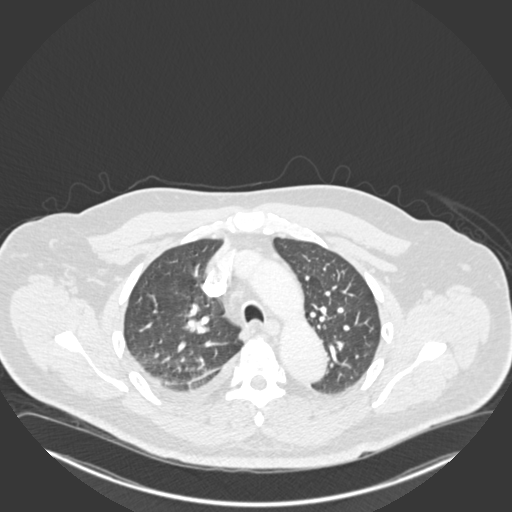
[im 292/354  soft-tissue]
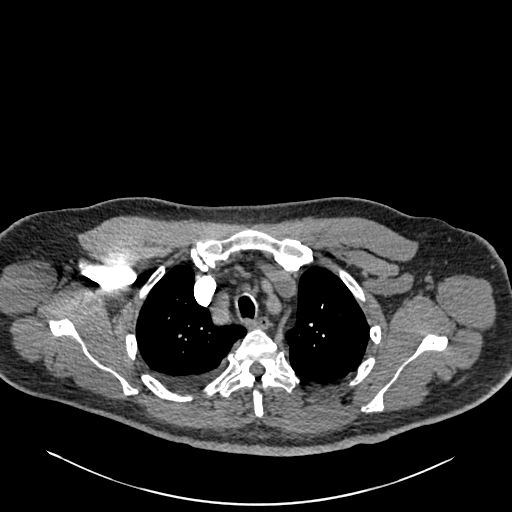
[im 307/354  lung]
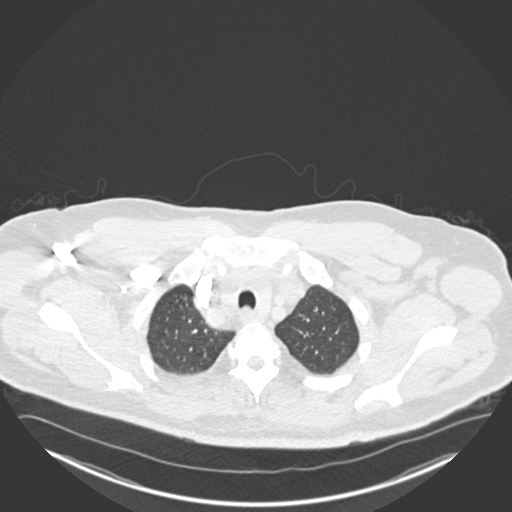
[im 338/354  soft-tissue]
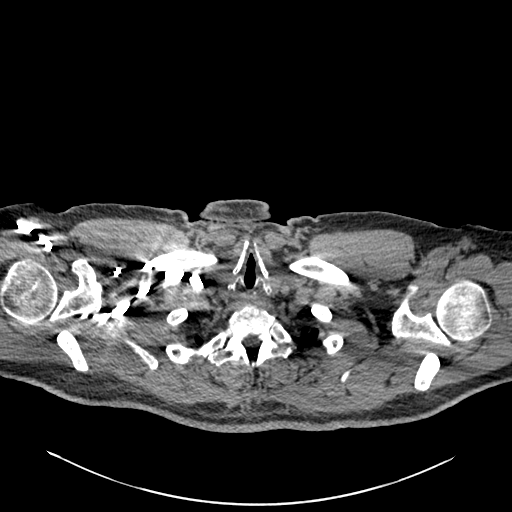

[Series 5: coronal mpr · coronal · 0.65mm/px · 3 of 157 slices shown]
[im 40/157  soft-tissue]
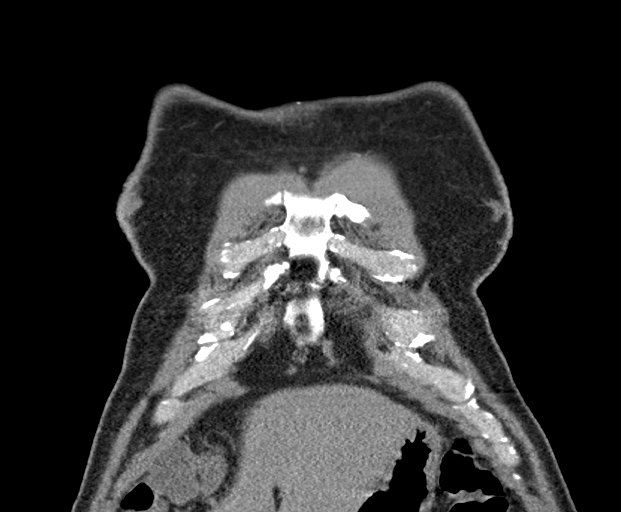
[im 79/157  soft-tissue]
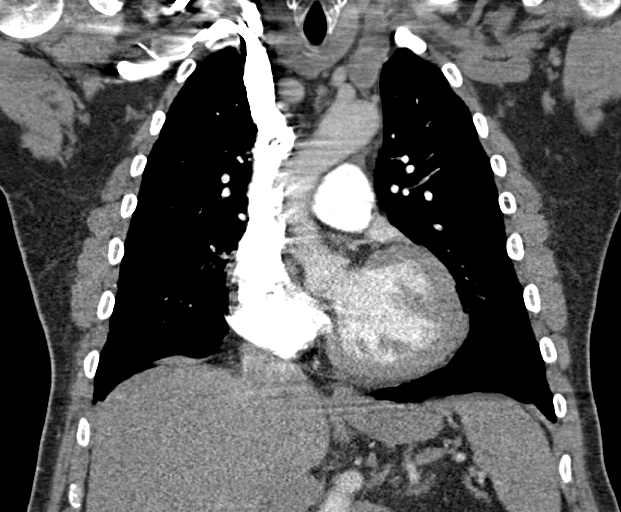
[im 118/157  soft-tissue]
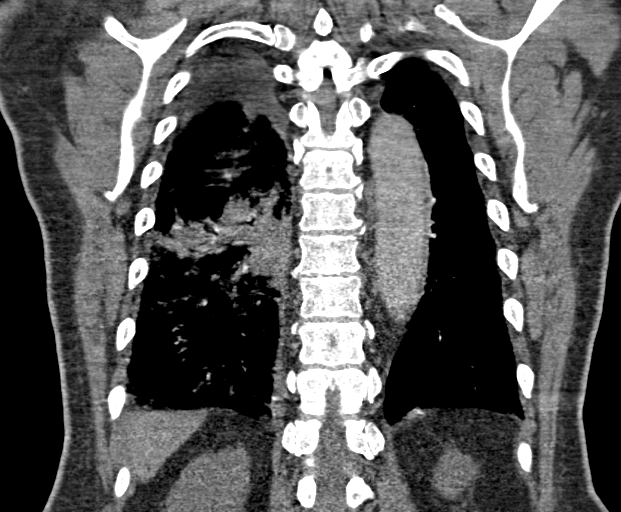

[17 of 46 positions shown; findings below may reference images not displayed]

RADIATION DOSE REDUCTION: This exam was performed according to the
departmental dose-optimization program which includes automated
exposure control, adjustment of the mA and/or kV according to
patient size and/or use of iterative reconstruction technique.

CONTRAST:  100mL OMNIPAQUE IOHEXOL 350 MG/ML SOLN
FINDINGS: CTA CHEST FINDINGS

Cardiovascular: Satisfactory opacification the bilateral pulmonary
arteries to the segmental level. No evidence of pulmonary embolism.

Study is not tailored for evaluation of the thoracic aorta. No
evidence of thoracic aortic aneurysm.

Are normal in size.  No pericardial effusion.

Mediastinum/Nodes: Small mediastinal lymph nodes which do not meet
pathologic CT size criteria, likely reactive.

Visualized thyroid is unremarkable.

Lungs/Pleura: Lobar consolidation in the right lower lobe, favoring
infection/pneumonia. Associated small right pleural effusion.

Evaluation lung parenchyma is constrained by respiratory motion.
Within that constraint, there are no suspicious pulmonary nodules.

No pneumothorax.

Musculoskeletal: Very mild degenerative changes of the lower
thoracic spine.

Review of the MIP images confirms the above findings.

CT ABDOMEN and PELVIS FINDINGS

Hepatobiliary: Liver is within normal limits.

Status post cholecystectomy. No intrahepatic or extrahepatic duct
dilatation.

Pancreas: Within normal limits.

Spleen: Within normal limits.

Adrenals/Urinary Tract: 2.5 cm right adrenal adenoma. Left adrenal
gland is within normal limits.

Kidneys are within normal limits.  No hydronephrosis.

Thick-walled bladder with herniation of the left anterior bladder to
the left inguinal canal (series 4/image 75).

Stomach/Bowel: Stomach is within normal limits.

No evidence of bowel obstruction.

Normal appendix (series 4/image 62).

No colonic wall thickening or inflammatory changes.

Vascular/Lymphatic: No evidence of abdominal aortic aneurysm.

No suspicious abdominopelvic lymphadenopathy.

Reproductive: Prostatomegaly, with enlargement of the central gland
indenting the base of the bladder, suggesting BPH.

Other: No abdominopelvic ascites.

Tiny fat containing right inguinal hernia (series 4/image 80).
Moderate fat containing left inguinal hernia containing a portion of
the left anterior bladder (series 4/image 39), as described above.

Musculoskeletal: Mild degenerative changes of the lumbar spine.

Review of the MIP images confirms the above findings.
IMPRESSION: No evidence of pulmonary embolism.

Right lower lobe pneumonia. Follow-up chest radiographs are
suggested in 6 weeks to document resolution/improvement.

Moderate left inguinal hernia containing a portion of the left
anterior bladder.

Additional ancillary findings as above.

## 2023-11-16 ENCOUNTER — Encounter: Payer: Self-pay | Admitting: Gastroenterology

## 2023-12-16 ENCOUNTER — Ambulatory Visit (AMBULATORY_SURGERY_CENTER): Admitting: *Deleted

## 2023-12-16 VITALS — Ht 70.0 in | Wt 230.0 lb

## 2023-12-16 DIAGNOSIS — Z1211 Encounter for screening for malignant neoplasm of colon: Secondary | ICD-10-CM

## 2023-12-16 MED ORDER — NA SULFATE-K SULFATE-MG SULF 17.5-3.13-1.6 GM/177ML PO SOLN: 1.0000 | Freq: Once | ORAL | 0 refills | Status: AC

## 2023-12-16 NOTE — Progress Notes (Signed)
 Pt's name and DOB verified at the beginning of the pre-visit wit 2 identifiers  Pt denies any difficulty with ambulating,sitting, laying down or rolling side to side  Pt has no issues moving head neck or swallowing  No egg or soy allergy known to patient   No issues known to pt with past sedation with any surgeries or procedures  No FH of Malignant Hyperthermia  Pt is not on home 02   Pt is not on blood thinners   Pt has frequent issues with constipation RN instructed pt to use Miralax per bottles instructions a week before prep days. Pt states they will  Pt is not on dialysis  Pt denise any abnormal heart rhythms   Pt denies any upcoming cardiac testing  Patient's chart reviewed by Rogena Class CNRA prior to pre-visit and patient appropriate for the [facility].  Pre-visit completed and red dot placed by patient's name on their procedure day (on provider's schedule).    Visit by phon  Pt states weight is 230 lb   IInstructions reviewed. Pt given  both [facility] main # and MD on call # prior to instructions.  Pt states understanding of instructions. Instructed pt to review instructions again prior to procedure and call main # given if has questions.. Pt states they will.   Instructed pt on where to find instructions on My Chart.

## 2024-01-09 ENCOUNTER — Ambulatory Visit: Admitting: Gastroenterology

## 2024-01-09 ENCOUNTER — Encounter: Payer: Self-pay | Admitting: Gastroenterology

## 2024-01-09 VITALS — BP 137/55 | HR 49 | Temp 97.9°F | Resp 14 | Ht 70.0 in | Wt 230.0 lb

## 2024-01-09 DIAGNOSIS — D123 Benign neoplasm of transverse colon: Secondary | ICD-10-CM | POA: Diagnosis not present

## 2024-01-09 DIAGNOSIS — D125 Benign neoplasm of sigmoid colon: Secondary | ICD-10-CM

## 2024-01-09 DIAGNOSIS — K5730 Diverticulosis of large intestine without perforation or abscess without bleeding: Secondary | ICD-10-CM

## 2024-01-09 DIAGNOSIS — Z1211 Encounter for screening for malignant neoplasm of colon: Secondary | ICD-10-CM | POA: Diagnosis not present

## 2024-01-09 DIAGNOSIS — K648 Other hemorrhoids: Secondary | ICD-10-CM

## 2024-01-09 DIAGNOSIS — D124 Benign neoplasm of descending colon: Secondary | ICD-10-CM

## 2024-01-09 DIAGNOSIS — K641 Second degree hemorrhoids: Secondary | ICD-10-CM

## 2024-01-09 MED ORDER — SODIUM CHLORIDE 0.9 % IV SOLN: 500.0000 mL | Freq: Once | INTRAVENOUS | Status: DC

## 2024-01-09 NOTE — Progress Notes (Signed)
 Pt's states no medical or surgical changes since previsit or office visit.

## 2024-01-09 NOTE — Progress Notes (Signed)
 Vss nad trans to pacu

## 2024-01-09 NOTE — Progress Notes (Signed)
 Called to room to assist during endoscopic procedure.  Patient ID and intended procedure confirmed with present staff. Received instructions for my participation in the procedure from the performing physician.

## 2024-01-09 NOTE — Patient Instructions (Signed)
 Thank you for letting us  care for your healthcare needs today! Please see handouts regarding Polyps, Diverticulosis, and Hemorrhoids. Resume previous diet & medications. Await pathology results. Repeat colonoscopy in 1 year with a 2-day prep.  YOU HAD AN ENDOSCOPIC PROCEDURE TODAY AT THE [facility]:   Refer to the procedure report that was given to you for any specific questions about what was found during the examination.  If the procedure report does not answer your questions, please call your gastroenterologist to clarify.  If you requested that your care partner not be given the details of your procedure findings, then the procedure report has been included in a sealed envelope for you to review at your convenience later.  YOU SHOULD EXPECT: Some feelings of bloating in the abdomen. Passage of more gas than usual.  Walking can help get rid of the air that was put into your GI tract during the procedure and reduce the bloating. If you had a lower endoscopy (such as a colonoscopy or flexible sigmoidoscopy) you may notice spotting of blood in your stool or on the toilet paper. If you underwent a bowel prep for your procedure, you may not have a normal bowel movement for a few days.  Please Note:  You might notice some irritation and congestion in your nose or some drainage.  This is from the oxygen used during your procedure.  There is no need for concern and it should clear up in a day or so.  SYMPTOMS TO REPORT IMMEDIATELY:  Following lower endoscopy (colonoscopy or flexible sigmoidoscopy):  Excessive amounts of blood in the stool  Significant tenderness or worsening of abdominal pains  Swelling of the abdomen that is new, acute  Fever of 100F or higher  For urgent or emergent issues, a gastroenterologist can be reached at any hour by calling [phone]. Do not use MyChart messaging for urgent concerns.    DIET:  We do recommend a small meal at first, but then you may  proceed to your regular diet.  Drink plenty of fluids but you should avoid alcoholic beverages for 24 hours.  ACTIVITY:  You should plan to take it easy for the rest of today and you should NOT DRIVE or use heavy machinery until tomorrow (because of the sedation medicines used during the test).    FOLLOW UP: Our staff will call the number listed on your records the next business day following your procedure.  We will call around 7:15- 8:00 am to check on you and address any questions or concerns that you may have regarding the information given to you following your procedure. If we do not reach you, we will leave a message.     If any biopsies were taken you will be contacted by phone or by letter within the next 1-3 weeks.  Please call us  at [phone] if you have not heard about the biopsies in 3 weeks.    SIGNATURES/CONFIDENTIALITY: You and/or your care partner have signed paperwork which will be entered into your electronic medical record.  These signatures attest to the fact that that the information above on your After Visit Summary has been reviewed and is understood.  Full responsibility of the confidentiality of this discharge information lies with you and/or your care-partner.

## 2024-01-09 NOTE — Progress Notes (Signed)
 GASTROENTEROLOGY PROCEDURE H&P NOTE   Primary Care Physician: Loreli Elsie JONETTA Mickey., MD    Reason for Procedure:  Colon Cancer screening  Plan:    Colonoscopy  Patient is appropriate for endoscopic procedure(s) in the ambulatory ([facility]) setting.  The nature of the procedure, as well as the risks, benefits, and alternatives were carefully and thoroughly reviewed with the patient. Ample time for discussion and questions allowed. The patient understood, was satisfied, and agreed to proceed.     HPI: Lawrence Macdonald is a 68 y.o. male who presents for colonoscopy for routine Colon Cancer screening.  No active GI symptoms.  No known family history of colon cancer or related malignancy.  Patient is otherwise without complaints or active issues today.  Past Medical History:  Diagnosis Date   Arthritis    Cancer [facility])    Prostate   Cataract    Diabetes mellitus without complication (HCC)    Gallstones    GERD (gastroesophageal reflux disease)    Hypertension    Pneumonia     Past Surgical History:  Procedure Laterality Date   CHOLECYSTECTOMY N/A 08/23/2020   Procedure: LAPAROSCOPIC CHOLECYSTECTOMY WITH INTRAOPERATIVE CHOLANGIOGRAM;  Surgeon: Tanda Locus, MD;  Location: [facility];  Service: General;  Laterality: N/A;   ERCP N/A 08/24/2020   Procedure: ENDOSCOPIC RETROGRADE CHOLANGIOPANCREATOGRAPHY (ERCP);  Surgeon: Rosalie Kitchens, MD;  Location: [facility];  Service: Endoscopy;  Laterality: N/A;   inguinal hernia Left    LYMPHADENECTOMY Bilateral 02/03/2023   Procedure: BILATERAL PELVIC LYMPHADENECTOMY;  Surgeon: Renda Glance, MD;  Location: [facility];  Service: Urology;  Laterality: Bilateral;   PROSTATE BIOPSY     REMOVAL OF STONES  08/24/2020   Procedure: REMOVAL OF STONES;  Surgeon: Rosalie Kitchens, MD;  Location: [facility];  Service: Endoscopy;;   ROBOT ASSISTED LAPAROSCOPIC RADICAL PROSTATECTOMY N/A 02/03/2023   Procedure: XI ROBOTIC ASSISTED LAPAROSCOPIC RADICAL PROSTATECTOMY LEVEL  2;  Surgeon: Renda Glance, MD;  Location: [facility];  Service: Urology;  Laterality: N/A;  210 MINUTES NEEDED FOR CASE   SPHINCTEROTOMY  08/24/2020   Procedure: SPHINCTEROTOMY;  Surgeon: Rosalie Kitchens, MD;  Location: [facility];  Service: Endoscopy;;   TONSILLECTOMY      Prior to Admission medications   Medication Sig Start Date End Date Taking? Authorizing Provider  amLODipine  (NORVASC ) 10 MG tablet Take 10 mg by mouth daily. 06/26/19 01/09/24 Yes [provider]  atorvastatin  (LIPITOR) 80 MG tablet Take 80 mg by mouth daily.   Yes [provider]  fluticasone  (FLONASE ) 50 MCG/ACT nasal spray Place 1 spray into both nostrils as needed for allergies or rhinitis.   Yes [provider]  hydrALAZINE  (APRESOLINE ) 100 MG tablet Take 100 mg by mouth daily.   Yes [provider]  metFORMIN (GLUCOPHAGE-XR) 500 MG 24 hr tablet Take 500 mg by mouth daily.   Yes [provider]  valsartan (DIOVAN) 320 MG tablet Take 320 mg by mouth daily. 11/25/19  Yes [provider]  acetaminophen  (TYLENOL ) 500 MG tablet You can take 1000 mg of Tylenol /acetaminophen  every 6 hours as needed for pain.  Do not exceed 4000 mg of Tylenol  per day it can harm your liver.  You can buy this over-the-counter at any drugstore.  You can use naproxen  in addition to plain Tylenol  for pain. 08/25/20   Tonnie George, PA-C  docusate sodium  (COLACE) 100 MG capsule Take 1 capsule (100 mg total) by mouth 2 (two) times daily. 08/25/20   Shahmehdi, Seyed A, MD  Naphazoline-Pheniramine (  VISINE OP) Place 1 drop into both eyes daily as needed (irritation).    [provider]  traMADol  (ULTRAM ) 50 MG tablet Take 1-2 tablets (50-100 mg total) by mouth every 6 (six) hours as needed for moderate pain or severe pain. Patient not taking: Reported on 12/16/2023 02/03/23   Cory Palma, PA-C    Current Outpatient Medications  Medication Sig Dispense Refill   amLODipine  (NORVASC ) 10 MG tablet  Take 10 mg by mouth daily.     atorvastatin  (LIPITOR) 80 MG tablet Take 80 mg by mouth daily.     fluticasone  (FLONASE ) 50 MCG/ACT nasal spray Place 1 spray into both nostrils as needed for allergies or rhinitis.     hydrALAZINE  (APRESOLINE ) 100 MG tablet Take 100 mg by mouth daily.     metFORMIN (GLUCOPHAGE-XR) 500 MG 24 hr tablet Take 500 mg by mouth daily.     valsartan (DIOVAN) 320 MG tablet Take 320 mg by mouth daily.     acetaminophen  (TYLENOL ) 500 MG tablet You can take 1000 mg of Tylenol /acetaminophen  every 6 hours as needed for pain.  Do not exceed 4000 mg of Tylenol  per day it can harm your liver.  You can buy this over-the-counter at any drugstore.  You can use naproxen  in addition to plain Tylenol  for pain. 30 tablet 0   docusate sodium  (COLACE) 100 MG capsule Take 1 capsule (100 mg total) by mouth 2 (two) times daily. 10 capsule 0   Naphazoline-Pheniramine (VISINE OP) Place 1 drop into both eyes daily as needed (irritation).     traMADol  (ULTRAM ) 50 MG tablet Take 1-2 tablets (50-100 mg total) by mouth every 6 (six) hours as needed for moderate pain or severe pain. (Patient not taking: Reported on 12/16/2023) 20 tablet 0   Current Facility-Administered Medications  Medication Dose Route Frequency Provider Last Rate Last Admin   0.9 %  sodium chloride  infusion  500 mL Intravenous Once Franciszek Platten V, DO        Allergies as of 01/09/2024 - Review Complete 01/09/2024  Allergen Reaction Noted   Hydrochlorothiazide Other (See Comments) 05/28/2016   Lisinopril Cough 05/28/2016   Sulfadiazine Rash 02/16/2020    Family History  Problem Relation Age of Onset   Breast cancer Mother 68 - 65   Colon polyps Brother    Prostate cancer Brother 83 - 45       paternal half-brother   Breast cancer Maternal Aunt 53   Prostate cancer Maternal Uncle    Prostate cancer Cousin        2 maternal first cousins   Colon cancer Neg Hx    Esophageal cancer Neg Hx    Rectal cancer Neg Hx     Stomach cancer Neg Hx     Social History   Socioeconomic History   Marital status: Single    Spouse name: Not on file   Number of children: Not on file   Years of education: Not on file   Highest education level: Not on file  Occupational History   Not on file  Tobacco Use   Smoking status: Former    Current packs/day: 0.00    Types: Cigarettes    Quit date: 2004    Years since quitting: 21.5   Smokeless tobacco: Never  Vaping Use   Vaping status: Never Used  Substance and Sexual Activity   Alcohol use: Yes    Comment: occasion   Drug use: Never   Sexual activity: Not Currently  Other Topics  Concern   Not on file  Social History Narrative   Patient is a retired Retail banker    Social Drivers of Corporate investment banker Strain: Not on file  Food Insecurity: No Food Insecurity (01/11/2023)   Hunger Vital Sign    Worried About Running Out of Food in the Last Year: Never true    Ran Out of Food in the Last Year: Never true  Transportation Needs: No Transportation Needs (01/11/2023)   PRAPARE - Administrator, Civil Service (Medical): No    Lack of Transportation (Non-Medical): No  Physical Activity: Not on file  Stress: Not on file  Social Connections: Not on file  Intimate Partner Violence: Not At Risk (01/11/2023)   Humiliation, Afraid, Rape, and Kick questionnaire    Fear of Current or Ex-Partner: No    Emotionally Abused: No    Physically Abused: No    Sexually Abused: No    Physical Exam: Vital signs in last 24 hours: @BP  128/82   Pulse 63   Temp 97.9 F (36.6 C) (Temporal)   Ht 5' 10 (1.778 m)   Wt 230 lb (104.3 kg)   SpO2 98%   BMI 33.00 kg/m  GEN: NAD EYE: Sclerae anicteric ENT: MMM CV: Non-tachycardic Pulm: CTA b/l GI: Soft, NT/ND NEURO:  Alert & Oriented x 3   Sandor Flatter, DO [facility]   01/09/2024 10:40 AM

## 2024-01-09 NOTE — Op Note (Signed)
 [facility] Patient Name: Lawrence Macdonald Procedure Date: 01/09/2024 10:34 AM MRN: [id number] Endoscopist: Sandor Flatter , MD, [id number] Age: 68 Referring MD:  Date of Birth: [birth year] Gender: Male Account #: [id number] Procedure:                Colonoscopy Indications:              Screening for colorectal malignant neoplasm, This                            is the patient's first colonoscopy Medicines:                Monitored Anesthesia Care Procedure:                Pre-Anesthesia Assessment:                           - Prior to the procedure, a History and Physical                            was performed, and patient medications and                            allergies were reviewed. The patient's tolerance of                            previous anesthesia was also reviewed. The risks                            and benefits of the procedure and the sedation                            options and risks were discussed with the patient.                            All questions were answered, and informed consent                            was obtained. Prior Anticoagulants: The patient has                            taken no anticoagulant or antiplatelet agents. ASA                            Grade Assessment: III - A patient with severe                            systemic disease. After reviewing the risks and                            benefits, the patient was deemed in satisfactory                            condition to undergo the procedure.  After obtaining informed consent, the colonoscope                            was passed under direct vision. Throughout the                            procedure, the patient's blood pressure, pulse, and                            oxygen saturations were monitored continuously. The                            CF [id number] #[id number] was introduced through the anus                            and advanced to the  the cecum, identified by                            appendiceal orifice and ileocecal valve. The                            colonoscopy was technically difficult and complex                            due to inadequate bowel prep. The patient tolerated                            the procedure well. The quality of the bowel                            preparation was fair. The ileocecal valve,                            appendiceal orifice, and rectum were photographed. Scope In: 10:48:18 AM Scope Out: 11:17:00 AM Scope Withdrawal Time: 0 hours 21 minutes 24 seconds  Total Procedure Duration: 0 hours 28 minutes 42 seconds  Findings:                 The perianal and digital rectal examinations were                            normal.                           Four sessile polyps were found in the transverse                            colon. The polyps were 4 to 10 mm in size. These                            polyps were removed with a cold snare. Resection                            and retrieval were complete. Estimated blood  loss                            was minimal.                           A 20 mm polyp was found in the transverse colon.                            The polyp was sessile. The polyp was removed with a                            cold snare via piecemeal technique. Resection and                            retrieval were complete. Estimated blood loss was                            minimal.                           Two sessile polyps were found in the sigmoid colon                            and descending colon. The polyps were 5 to 12 mm in                            size. These polyps were removed with a cold snare.                            Resection and retrieval were complete. Estimated                            blood loss was minimal.                           Multiple large-mouthed and small-mouthed                            diverticula were found in the sigmoid  colon,                            descending colon and transverse colon.                           Non-bleeding internal hemorrhoids were found during                            retroflexion. The hemorrhoids were small.                           A moderate amount of semi-solid stool was found in                            the entire colon, interfering with  visualization.                            Lavage of the area was performed using copious                            amounts of sterile water , resulting in incomplete                            clearance with fair visualization. Complications:            No immediate complications. Estimated Blood Loss:     Estimated blood loss was minimal. Impression:               - Preparation of the colon was fair.                           - Four 4 to 10 mm polyps in the transverse colon,                            removed with a cold snare. Resected and retrieved.                           - One 20 mm polyp in the transverse colon, removed                            with a cold snare. Resected and retrieved.                           - Two 5 to 12 mm polyps in the sigmoid colon and in                            the descending colon, removed with a cold snare.                            Resected and retrieved.                           - Diverticulosis in the sigmoid colon, in the                            descending colon and in the transverse colon.                           - Non-bleeding internal hemorrhoids.                           - Stool in the entire examined colon. Recommendation:           - Patient has a contact number available for                            emergencies. The signs and symptoms of potential  delayed complications were discussed with the                            patient. Return to normal activities tomorrow.                            Written discharge instructions were provided to the                             patient.                           - Resume previous diet.                           - Continue present medications.                           - Await pathology results.                           - Repeat colonoscopy in 1 year because the bowel                            preparation was suboptimal and for surveillance.                           - Extended 2-day prep with repeat colonoscopy.                           - Return to GI office PRN. Sandor Flatter, MD 01/09/2024 11:24:16 AM

## 2024-01-10 ENCOUNTER — Telehealth: Payer: Self-pay | Admitting: *Deleted

## 2024-01-10 NOTE — Telephone Encounter (Signed)
  Follow up Call-     01/09/2024    9:30 AM  Call back number  Post procedure Call Back phone  # [phone]  Permission to leave phone message No  comments no does not listen to VM per pt     Patient questions:   No VM message left.

## 2024-01-10 NOTE — Telephone Encounter (Signed)
 PT returned call and stated he is doing well post procedure

## 2024-01-12 LAB — SURGICAL PATHOLOGY

## 2024-01-16 ENCOUNTER — Ambulatory Visit: Payer: Self-pay | Admitting: Gastroenterology
